# Patient Record
Sex: Male | Born: 1935 | Race: White | Hispanic: No | Marital: Married
Health system: Southern US, Community
[De-identification: ages and names within clinical notes are randomized; demographics above are authoritative.]

## PROBLEM LIST (undated history)

## (undated) DIAGNOSIS — G20 Parkinson's disease: Secondary | ICD-10-CM

## (undated) DIAGNOSIS — I10 Essential (primary) hypertension: Secondary | ICD-10-CM

## (undated) DIAGNOSIS — E785 Hyperlipidemia, unspecified: Secondary | ICD-10-CM

## (undated) DIAGNOSIS — G20A1 Parkinson's disease without dyskinesia, without mention of fluctuations: Secondary | ICD-10-CM

---

## 2004-01-01 ENCOUNTER — Encounter: Payer: Self-pay | Admitting: Internal Medicine

## 2004-04-19 ENCOUNTER — Encounter: Payer: Self-pay | Admitting: Internal Medicine

## 2004-05-03 ENCOUNTER — Encounter: Payer: Self-pay | Admitting: Internal Medicine

## 2004-08-16 ENCOUNTER — Encounter: Payer: Self-pay | Admitting: Internal Medicine

## 2004-08-31 ENCOUNTER — Encounter: Payer: Self-pay | Admitting: Internal Medicine

## 2004-12-20 ENCOUNTER — Encounter: Payer: Self-pay | Admitting: Internal Medicine

## 2004-12-31 ENCOUNTER — Encounter: Payer: Self-pay | Admitting: Internal Medicine

## 2005-05-01 ENCOUNTER — Encounter: Payer: Self-pay | Admitting: Internal Medicine

## 2005-05-03 ENCOUNTER — Encounter: Payer: Self-pay | Admitting: Internal Medicine

## 2005-08-17 ENCOUNTER — Encounter: Payer: Self-pay | Admitting: Internal Medicine

## 2005-08-31 ENCOUNTER — Encounter: Payer: Self-pay | Admitting: Internal Medicine

## 2005-12-12 ENCOUNTER — Encounter: Payer: Self-pay | Admitting: Internal Medicine

## 2005-12-31 ENCOUNTER — Encounter: Payer: Self-pay | Admitting: Internal Medicine

## 2006-04-10 ENCOUNTER — Encounter: Payer: Self-pay | Admitting: Internal Medicine

## 2006-05-03 ENCOUNTER — Encounter: Payer: Self-pay | Admitting: Internal Medicine

## 2006-08-14 ENCOUNTER — Ambulatory Visit: Payer: Self-pay | Admitting: Internal Medicine

## 2006-12-12 ENCOUNTER — Encounter: Payer: Self-pay | Admitting: Internal Medicine

## 2006-12-25 ENCOUNTER — Other Ambulatory Visit: Payer: Self-pay

## 2006-12-25 ENCOUNTER — Inpatient Hospital Stay: Payer: Self-pay | Admitting: Internal Medicine

## 2007-01-01 ENCOUNTER — Encounter: Payer: Self-pay | Admitting: Internal Medicine

## 2010-10-23 ENCOUNTER — Ambulatory Visit: Payer: Self-pay | Admitting: Family Medicine

## 2010-11-03 ENCOUNTER — Ambulatory Visit: Payer: Self-pay | Admitting: Internal Medicine

## 2011-01-22 ENCOUNTER — Ambulatory Visit: Payer: Self-pay | Admitting: Internal Medicine

## 2014-12-16 ENCOUNTER — Other Ambulatory Visit: Payer: Self-pay | Admitting: Physical Medicine and Rehabilitation

## 2014-12-16 DIAGNOSIS — M5416 Radiculopathy, lumbar region: Secondary | ICD-10-CM

## 2014-12-22 ENCOUNTER — Ambulatory Visit
Admission: RE | Admit: 2014-12-22 | Discharge: 2014-12-22 | Disposition: A | Discharge status: Home / Self Care | Payer: Medicare Other | Source: Ambulatory Visit | Attending: Physical Medicine and Rehabilitation | Admitting: Physical Medicine and Rehabilitation

## 2014-12-22 DIAGNOSIS — M5416 Radiculopathy, lumbar region: Secondary | ICD-10-CM | POA: Diagnosis present

## 2014-12-22 DIAGNOSIS — M5126 Other intervertebral disc displacement, lumbar region: Secondary | ICD-10-CM | POA: Diagnosis not present

## 2014-12-22 DIAGNOSIS — M4806 Spinal stenosis, lumbar region: Secondary | ICD-10-CM | POA: Insufficient documentation

## 2017-10-02 ENCOUNTER — Emergency Department
Admission: EM | Admit: 2017-10-02 | Discharge: 2017-10-02 | Disposition: A | Discharge status: Home / Self Care | Payer: Medicare Other | Attending: Emergency Medicine | Admitting: Emergency Medicine

## 2017-10-02 ENCOUNTER — Encounter: Payer: Self-pay | Admitting: Emergency Medicine

## 2017-10-02 ENCOUNTER — Emergency Department: Payer: Medicare Other

## 2017-10-02 DIAGNOSIS — R531 Weakness: Secondary | ICD-10-CM

## 2017-10-02 DIAGNOSIS — W19XXXA Unspecified fall, initial encounter: Secondary | ICD-10-CM

## 2017-10-02 DIAGNOSIS — R55 Syncope and collapse: Secondary | ICD-10-CM | POA: Diagnosis not present

## 2017-10-02 HISTORY — DX: Hyperlipidemia, unspecified: E78.5

## 2017-10-02 HISTORY — DX: Essential (primary) hypertension: I10

## 2017-10-02 LAB — URINALYSIS, COMPLETE (UACMP) WITH MICROSCOPIC
BACTERIA UA: NONE SEEN
BILIRUBIN URINE: NEGATIVE
Glucose, UA: NEGATIVE mg/dL
Hgb urine dipstick: NEGATIVE
KETONES UR: NEGATIVE mg/dL
LEUKOCYTES UA: NEGATIVE
Nitrite: NEGATIVE
PH: 7 (ref 5.0–8.0)
PROTEIN: NEGATIVE mg/dL
SQUAMOUS EPITHELIAL / LPF: NONE SEEN (ref 0–5)
Specific Gravity, Urine: 1.012 (ref 1.005–1.030)
WBC UA: NONE SEEN WBC/hpf (ref 0–5)

## 2017-10-02 LAB — COMPREHENSIVE METABOLIC PANEL
ALBUMIN: 3.5 g/dL (ref 3.5–5.0)
ALT: 9 U/L (ref 0–44)
AST: 20 U/L (ref 15–41)
Alkaline Phosphatase: 70 U/L (ref 38–126)
Anion gap: 6 (ref 5–15)
BUN: 15 mg/dL (ref 8–23)
CHLORIDE: 108 mmol/L (ref 98–111)
CO2: 27 mmol/L (ref 22–32)
CREATININE: 0.61 mg/dL (ref 0.61–1.24)
Calcium: 9 mg/dL (ref 8.9–10.3)
GFR calc Af Amer: >60 mL/min (ref >60)
GLUCOSE: 107 mg/dL — AB (ref 70–99)
Potassium: 3.8 mmol/L (ref 3.5–5.1)
Sodium: 141 mmol/L (ref 135–145)
Total Bilirubin: 1.3 mg/dL — ABNORMAL HIGH (ref 0.3–1.2)
Total Protein: 6.3 g/dL — ABNORMAL LOW (ref 6.5–8.1)

## 2017-10-02 LAB — CBC WITH DIFFERENTIAL/PLATELET
BASOS ABS: 0 10*3/uL (ref 0–0.1)
BASOS PCT: 1 %
Eosinophils Absolute: 0.1 10*3/uL (ref 0–0.7)
Eosinophils Relative: 3 %
HCT: 41.5 % (ref 40.0–52.0)
Hemoglobin: 14.3 g/dL (ref 13.0–18.0)
LYMPHS PCT: 19 %
Lymphs Abs: 1 10*3/uL (ref 1.0–3.6)
MCH: 33.3 pg (ref 26.0–34.0)
MCHC: 34.5 g/dL (ref 32.0–36.0)
MCV: 96.3 fL (ref 80.0–100.0)
Monocytes Absolute: 0.3 10*3/uL (ref 0.2–1.0)
Monocytes Relative: 7 %
NEUTROS ABS: 3.9 10*3/uL (ref 1.4–6.5)
Neutrophils Relative %: 72 %
PLATELETS: 129 10*3/uL — AB (ref 150–440)
RBC: 4.31 MIL/uL — AB (ref 4.40–5.90)
RDW: 14 % (ref 11.5–14.5)
WBC: 5.4 10*3/uL (ref 3.8–10.6)

## 2017-10-02 LAB — TROPONIN I

## 2017-10-02 LAB — GLUCOSE, CAPILLARY: GLUCOSE-CAPILLARY: 95 mg/dL (ref 70–99)

## 2017-10-02 MED ORDER — SODIUM CHLORIDE 0.9 % IV SOLN
Freq: Once | INTRAVENOUS | Status: AC
  Administered 2017-10-02: 17:00:00 via INTRAVENOUS

## 2017-10-02 MED ORDER — SODIUM CHLORIDE 0.9 % IV SOLN
1000.0000 mL | Freq: Once | INTRAVENOUS | Status: AC
  Administered 2017-10-02: 1000 mL via INTRAVENOUS

## 2017-10-02 NOTE — ED Provider Notes (Signed)
Patient feels better after fluids however he still feels unsteady on his feet.  I recommended admission but the patient adamantly declined and wants to be discharged.  His family is supportive of him being discharged as well.  Informed him that he can return anytime and that he will need a lot of support   Jene EveryKinner, Tonishia Steffy, MD 10/02/17 1731

## 2017-10-02 NOTE — ED Notes (Signed)
BP lying 147/79, sitting 131/72. Pt unable to stand long enough to check standing BP. Pt and family state pt typically is able to ambulate independently with rolling walker. EDP Kinner notified. Pt states he would prefer to go home, spouse in agreement with patient. EDP to bedside.

## 2017-10-02 NOTE — ED Provider Notes (Signed)
[facility] Emergency Department Provider Note       Time seen: ----------------------------------------- 2:10 PM on 10/02/2017 -----------------------------------------   I have reviewed the triage vital signs and the nursing notes.  HISTORY   Chief Complaint No chief complaint on file.    HPI Keith Hicks is a 84 y.o. male with no known past medical history at this time who presents to the ED for syncope.  Patient was reportedly at home and he got to go to the bathroom and passed out.  He denies any injuries or trauma from the fall.  Patient states he has not passed out before.  He denies any recent illness, change in his medications or change in his p.o. intake.  No past medical history on file.  There are no active problems to display for this patient.  Allergies Patient has no allergy information on record.  Social History Social History   Tobacco Use  . Smoking status: Not on file  Substance Use Topics  . Alcohol use: Not on file  . Drug use: Not on file   Review of Systems Constitutional: Negative for fever. Cardiovascular: Negative for chest pain. Respiratory: Negative for shortness of breath. Gastrointestinal: Negative for abdominal pain, vomiting and diarrhea. Musculoskeletal: Negative for back pain. Skin: Negative for rash. Neurological: Negative for headaches, positive for weakness  All systems negative/normal/unremarkable except as stated in the HPI  ____________________________________________   PHYSICAL EXAM:  VITAL SIGNS: ED Triage Vitals  Enc Vitals Group     BP      Pulse      Resp      Temp      Temp src      SpO2      Weight      Height      Head Circumference      Peak Flow      Pain Score      Pain Loc      Pain Edu?      Excl. in GC?    Constitutional: Alert and oriented.  No acute distress Eyes: Conjunctivae are normal. Normal extraocular movements. ENT   Head: Normocephalic and  atraumatic.   Nose: No congestion/rhinnorhea.   Mouth/Throat: Mucous membranes are moist.   Neck: No stridor. Cardiovascular: Normal rate, regular rhythm. No murmurs, rubs, or gallops. Respiratory: Normal respiratory effort without tachypnea nor retractions. Breath sounds are clear and equal bilaterally. No wheezes/rales/rhonchi. Gastrointestinal: Soft and nontender. Normal bowel sounds Musculoskeletal: Nontender with normal range of motion in extremities. No lower extremity tenderness nor edema. Neurologic:  Normal speech and language. No gross focal neurologic deficits are appreciated.  Generalized weakness, nothing focal Skin:  Skin is warm, dry and intact. No rash noted. Psychiatric: Mood and affect are normal. Speech and behavior are normal.  ____________________________________________  EKG: Interpreted by me.  Sinus rhythm with a rate of 58 bpm, short PR interval, LVH, anterior Q waves, no ST elevation  ____________________________________________  ED COURSE:  As part of my medical decision making, I reviewed the following data within the electronic MEDICAL RECORD NUMBER History obtained from family if available, nursing notes, old chart and ekg, as well as notes from prior ED visits. Patient presented for syncope, we will assess with labs and imaging as indicated at this time.   Procedures ____________________________________________   LABS (pertinent positives/negatives)  Labs Reviewed  CBC WITH DIFFERENTIAL/PLATELET - Abnormal; Notable for the following components:      Result Value   RBC 4.31 (*)  Platelets 129 (*)    All other components within normal limits  COMPREHENSIVE METABOLIC PANEL - Abnormal; Notable for the following components:   Glucose, Bld 107 (*)    Total Protein 6.3 (*)    Total Bilirubin 1.3 (*)    All other components within normal limits  TROPONIN I  GLUCOSE, CAPILLARY  URINALYSIS, COMPLETE (UACMP) WITH MICROSCOPIC  CBG MONITORING, ED     RADIOLOGY  CT head, chest x-ray Are unremarkable ____________________________________________  DIFFERENTIAL DIAGNOSIS   Dehydration, electrolyte abnormality, arrhythmia, MI, head injury, occult infection  FINAL ASSESSMENT AND PLAN  Syncope   Plan: The patient had presented for a syncopal event that occurred at home. Patient's labs are pending at this time. Patient's imaging is negative thus far.  He is asymptomatic at this time.   Ulice Dash, MD   Note: This note was generated in part or whole with voice recognition software. Voice recognition is usually quite accurate but there are transcription errors that can and very often do occur. I apologize for any typographical errors that were not detected and corrected.     Emily Filbert, MD 10/02/17 1538

## 2017-10-02 NOTE — ED Triage Notes (Signed)
Pt to ED by EMS after LOC episode upon rising this morning. Pt states has continued to feel dizzy throughout the day.

## 2020-04-19 ENCOUNTER — Emergency Department: Payer: Medicare Other

## 2020-04-19 ENCOUNTER — Encounter: Payer: Self-pay | Admitting: Emergency Medicine

## 2020-04-19 ENCOUNTER — Emergency Department
Admission: EM | Admit: 2020-04-19 | Discharge: 2020-04-19 | Disposition: A | Discharge status: Home / Self Care | Payer: Medicare Other | Attending: Emergency Medicine | Admitting: Emergency Medicine

## 2020-04-19 ENCOUNTER — Other Ambulatory Visit: Payer: Self-pay

## 2020-04-19 DIAGNOSIS — Z20822 Contact with and (suspected) exposure to covid-19: Secondary | ICD-10-CM | POA: Insufficient documentation

## 2020-04-19 DIAGNOSIS — E785 Hyperlipidemia, unspecified: Secondary | ICD-10-CM | POA: Insufficient documentation

## 2020-04-19 DIAGNOSIS — M25552 Pain in left hip: Secondary | ICD-10-CM | POA: Insufficient documentation

## 2020-04-19 DIAGNOSIS — I10 Essential (primary) hypertension: Secondary | ICD-10-CM | POA: Diagnosis not present

## 2020-04-19 DIAGNOSIS — W19XXXA Unspecified fall, initial encounter: Secondary | ICD-10-CM | POA: Insufficient documentation

## 2020-04-19 DIAGNOSIS — J811 Chronic pulmonary edema: Secondary | ICD-10-CM | POA: Diagnosis not present

## 2020-04-19 DIAGNOSIS — G20 Parkinson's disease: Secondary | ICD-10-CM | POA: Diagnosis not present

## 2020-04-19 DIAGNOSIS — F0280 Dementia in other diseases classified elsewhere without behavioral disturbance: Secondary | ICD-10-CM | POA: Insufficient documentation

## 2020-04-19 HISTORY — DX: Parkinson's disease: G20

## 2020-04-19 HISTORY — DX: Parkinson's disease without dyskinesia, without mention of fluctuations: G20.A1

## 2020-04-19 LAB — CBC WITH DIFFERENTIAL/PLATELET
Abs Immature Granulocytes: 0.06 10*3/uL (ref 0.00–0.07)
Basophils Absolute: 0 10*3/uL (ref 0.0–0.1)
Basophils Relative: 0 %
Eosinophils Absolute: 0.1 10*3/uL (ref 0.0–0.5)
Eosinophils Relative: 2 %
HCT: 33.6 % — ABNORMAL LOW (ref 39.0–52.0)
Hemoglobin: 10.8 g/dL — ABNORMAL LOW (ref 13.0–17.0)
Immature Granulocytes: 1 %
Lymphocytes Relative: 10 %
Lymphs Abs: 0.8 10*3/uL (ref 0.7–4.0)
MCH: 28.6 pg (ref 26.0–34.0)
MCHC: 32.1 g/dL (ref 30.0–36.0)
MCV: 89.1 fL (ref 80.0–100.0)
Monocytes Absolute: 0.5 10*3/uL (ref 0.1–1.0)
Monocytes Relative: 6 %
Neutro Abs: 6.3 10*3/uL (ref 1.7–7.7)
Neutrophils Relative %: 81 %
Platelets: 159 10*3/uL (ref 150–400)
RBC: 3.77 MIL/uL — ABNORMAL LOW (ref 4.22–5.81)
RDW: 16.5 % — ABNORMAL HIGH (ref 11.5–15.5)
WBC: 7.8 10*3/uL (ref 4.0–10.5)
nRBC: 0 % (ref 0.0–0.2)

## 2020-04-19 LAB — COMPREHENSIVE METABOLIC PANEL
ALT: 11 U/L (ref 0–44)
AST: 21 U/L (ref 15–41)
Albumin: 3.5 g/dL (ref 3.5–5.0)
Alkaline Phosphatase: 110 U/L (ref 38–126)
Anion gap: 7 (ref 5–15)
BUN: 30 mg/dL — ABNORMAL HIGH (ref 8–23)
CO2: 23 mmol/L (ref 22–32)
Calcium: 9.7 mg/dL (ref 8.9–10.3)
Chloride: 110 mmol/L (ref 98–111)
Creatinine, Ser: 0.75 mg/dL (ref 0.61–1.24)
GFR, Estimated: >60 mL/min (ref >60)
Glucose, Bld: 114 mg/dL — ABNORMAL HIGH (ref 70–99)
Potassium: 5.1 mmol/L (ref 3.5–5.1)
Sodium: 140 mmol/L (ref 135–145)
Total Bilirubin: 0.5 mg/dL (ref 0.3–1.2)
Total Protein: 7.3 g/dL (ref 6.5–8.1)

## 2020-04-19 LAB — TROPONIN I (HIGH SENSITIVITY): Troponin I (High Sensitivity): 10 ng/L (ref <18)

## 2020-04-19 LAB — BRAIN NATRIURETIC PEPTIDE: B Natriuretic Peptide: 106.3 pg/mL — ABNORMAL HIGH (ref 0.0–100.0)

## 2020-04-19 MED ORDER — ACETAMINOPHEN 500 MG PO TABS
1000.0000 mg | ORAL_TABLET | Freq: Once | ORAL | Status: AC
  Administered 2020-04-19: 1000 mg via ORAL
  Filled 2020-04-19: qty 2

## 2020-04-19 NOTE — ED Notes (Signed)
[facility]  CALLED   FOR  TRANSPORT  TO [facility] 

## 2020-04-19 NOTE — ED Triage Notes (Signed)
EMS brings pt in from [facility]; staff reports unwitnessed fall from bed; pt denies falling and st he has no c/o

## 2020-04-19 NOTE — ED Notes (Signed)
Pt incontinent of urine , pt cleansed , purewick applied , monitor applied

## 2020-04-19 NOTE — ED Triage Notes (Signed)
Pt to triage via w/c with no distress noted; pt reports he got dizzy and fell; c/o pain to left shoulder, left hip and left ankle; denies hitting head but c/o persistent dizziness

## 2020-04-19 NOTE — ED Notes (Signed)
Called [facility] who stated that [facility] will call back to arrange transportation and take report on patient.

## 2020-04-19 NOTE — ED Provider Notes (Signed)
[facility] Emergency Department Provider Note  ____________________________________________   Event Date/Time   First MD Initiated Contact with Patient 04/19/20 1137     (approximate)  I have reviewed the triage vital signs and the nursing notes.   HISTORY  Chief Complaint Fall and Dizziness   HPI Keith Hicks is a 87 y.o. male with a PMH of HTN, HDL, and Parkinson who presents via EMS from a nursing facility for further evaluation after a fall.  Patient is able to state the month and the year and that he still has some pain in his left hip but is unable to provide any additional details.  He denies any other acute pain.  He did attempt to reach staff at facility but was unable to provide any additional details.  Staff did verify that he is not on any blood thinners and has some memory difficulty at baseline.  No other history is immediately available on patient arrival.         Past Medical History:  Diagnosis Date  . Hyperlipemia   . Hypertension   . Parkinson's disease (HCC)     There are no problems to display for this patient.   No past surgical history on file.  Prior to Admission medications   Medication Sig Start Date End Date Taking? Authorizing Provider  amLODipine-benazepril (LOTREL) 5-20 MG capsule Take 1 capsule by mouth daily. 09/30/17   [provider]  atorvastatin (LIPITOR) 10 MG tablet Take 1 tablet by mouth daily.    [provider]  metoprolol succinate (TOPROL-XL) 25 MG 24 hr tablet Take 1 tablet by mouth daily. 07/12/17   [provider]    Allergies Patient has no known allergies.  No family history on file.  Social History Social History   Tobacco Use  . Smoking status: Unknown If Ever Smoked  . Smokeless tobacco: Never Used  Substance Use Topics  . Alcohol use: Not Currently  . Drug use: Never    Review of Systems  Review of Systems  Unable to perform ROS: Dementia       ____________________________________________   PHYSICAL EXAM:  VITAL SIGNS: ED Triage Vitals  Enc Vitals Group     BP 04/19/20 0608 (!) 148/71     Pulse Rate 04/19/20 0608 64     Resp 04/19/20 0608 18     Temp 04/19/20 0608 97.8 F (36.6 C)     Temp Source 04/19/20 0608 Oral     SpO2 04/19/20 0608 95 %     Weight 04/19/20 0610 150 lb (68 kg)     Height 04/19/20 0610 5\' 11"  (1.803 m)     Head Circumference --      Peak Flow --      Pain Score 04/19/20 0612 6     Pain Loc --      Pain Edu? --      Excl. in GC? --    Vitals:   04/19/20 0959 04/19/20 1156  BP: (!) 179/69 (!) 179/78  Pulse: (!) 53 62  Resp: 16 16  Temp:    SpO2: 98% 96%   Physical Exam Vitals and nursing note reviewed.  Constitutional:      General: He is not in acute distress. HENT:     Head: Normocephalic and atraumatic.     Right Ear: External ear normal.     Left Ear: External ear normal.     Nose: Nose normal.     Mouth/Throat:  Pharynx: Oropharynx is clear.  Eyes:     Extraocular Movements: Extraocular movements intact.     Conjunctiva/sclera: Conjunctivae normal.     Pupils: Pupils are equal, round, and reactive to light.  Cardiovascular:     Rate and Rhythm: Regular rhythm. Bradycardia present.     Pulses: Normal pulses.  Pulmonary:     Effort: Pulmonary effort is normal. No respiratory distress.     Breath sounds: No stridor. No wheezing, rhonchi or rales.  Chest:     Chest wall: No tenderness.  Abdominal:     General: There is no distension.     Tenderness: There is no abdominal tenderness. There is no right CVA tenderness, left CVA tenderness or guarding.  Musculoskeletal:        General: No deformity.     Cervical back: No rigidity.     Right lower leg: No edema.     Left lower leg: No edema.  Skin:    Capillary Refill: Capillary refill takes less than 2 seconds.  Neurological:     Mental Status: He is alert.  Psychiatric:        Mood and Affect: Mood normal.      2+ bilateral radial and DP pulses. Patient has full and symmetric strength throughout all extremities. Sensation intact throughout all extremities. CN II-XII grossly intact. Oriented to month and year but not date or recent events beyond today.   TTP over L lateral hip. No deformity.  Some mild tenderness over the right parietal scalp. ____________________________________________   LABS (all labs ordered are listed, but only abnormal results are displayed)  Labs Reviewed  CBC WITH DIFFERENTIAL/PLATELET - Abnormal; Notable for the following components:      Result Value   RBC 3.77 (*)    Hemoglobin 10.8 (*)    HCT 33.6 (*)    RDW 16.5 (*)    All other components within normal limits  COMPREHENSIVE METABOLIC PANEL - Abnormal; Notable for the following components:   Glucose, Bld 114 (*)    BUN 30 (*)    All other components within normal limits  BRAIN NATRIURETIC PEPTIDE - Abnormal; Notable for the following components:   B Natriuretic Peptide 106.3 (*)    All other components within normal limits  SARS CORONAVIRUS 2 (TAT 6-24 HRS)  URINALYSIS, COMPLETE (UACMP) WITH MICROSCOPIC  TROPONIN I (HIGH SENSITIVITY)   ____________________________________________  EKG  Junctional rhythm with PVCs, normal axis, artifact vs non-specific chagnes in inferior leads. No other clear evidence of acute ischemia.  ____________________________________________  RADIOLOGY  ED MD interpretation:  CT head and C spine show no acute injury. Plain films of patients chest, L shoulder, and L ankle show no acute fracture or dislocation.  Chest x-ray shows some bilateral patchy haziness without clear focal consolidation, pneumothorax, rib fracture, large effusion or other clear acute process  Official radiology report(s): DG Chest 1 View  Result Date: 04/19/2020 CLINICAL DATA:  Fall.  Dizziness. EXAM: CHEST  1 VIEW COMPARISON:  Chest x-ray 10/29/2017. FINDINGS: Mediastinum and hilar structures are  unremarkable. Heart size normal. Low lung volumes. Diffuse bilateral pulmonary infiltrates/edema noted. No pleural effusion. IMPRESSION: Low lung volumes. Diffuse bilateral pulmonary infiltrates/edema noted. Electronically Signed   By: Maisie Fushomas  Register   On: 04/19/2020 12:08   DG Ankle Complete Left  Result Date: 04/19/2020 CLINICAL DATA:  Fall.  Left ankle pain. EXAM: LEFT ANKLE COMPLETE - 3+ VIEW COMPARISON:  None. FINDINGS: Mild soft tissue swelling is present bilaterally. Ankle joint is located.  No underlying fracture is present. No significant effusion is present. IMPRESSION: Mild soft tissue swelling without underlying fracture. Electronically Signed   By: Marin Robertshristopher  Mattern M.D.   On: 04/19/2020 07:36   CT Head Wo Contrast  Result Date: 04/19/2020 CLINICAL DATA:  Dizziness. Fall. Denies trauma to head. Persistent dizziness. EXAM: CT HEAD WITHOUT CONTRAST TECHNIQUE: Contiguous axial images were obtained from the base of the skull through the vertex without intravenous contrast. COMPARISON:  CT head 10/02/2017 FINDINGS: Brain: Moderate atrophy and scattered white matter disease is stable. No acute infarct, hemorrhage, or mass lesion is present. The ventricles are of normal size. No significant extraaxial fluid collection is present. The brainstem and cerebellum are within normal limits. Vascular: Atherosclerotic calcifications are present within the cavernous internal carotid arteries and at the dural margin of the vertebral arteries. No hyperdense vessel is present. Skull: Calvarium is intact. No focal lytic or blastic lesions are present. Right parietal and occipital scalp soft tissue swelling is present without underlying fracture or foreign body. Sinuses/Orbits: The paranasal sinuses and mastoid air cells are clear. The globes and orbits are within normal limits. IMPRESSION: 1. Right parietal and occipital scalp soft tissue swelling without underlying fracture or foreign body. 2. Stable atrophy  and scattered white matter disease. This likely reflects the sequela of chronic microvascular ischemia. 3. Atherosclerosis. Electronically Signed   By: Marin Robertshristopher  Mattern M.D.   On: 04/19/2020 06:57   CT Cervical Spine Wo Contrast  Addendum Date: 04/19/2020   ADDENDUM REPORT: 04/19/2020 12:58 ADDENDUM: Regarding heterogeneous thyroid without a dominant nodule, this is not clinically significant; no follow-up imaging recommended (ref: J Am Coll Radiol. 2015 Feb;12(2): 143-50). Electronically Signed   By: Marin Robertshristopher  Mattern M.D.   On: 04/19/2020 12:58   Result Date: 04/19/2020 CLINICAL DATA:  Fall.  Dizziness. EXAM: CT CERVICAL SPINE WITHOUT CONTRAST TECHNIQUE: Multidetector CT imaging of the cervical spine was performed without intravenous contrast. Multiplanar CT image reconstructions were also generated. COMPARISON:  None. FINDINGS: Alignment: Slight degenerative anterolisthesis is noted at C3-4 and C4-5. There straightening of the normal cervical lordosis. No other significant listhesis is present. Skull base and vertebrae: Craniocervical junction is normal. Vertebral body heights are normal. Acute or healing fractures are present. Soft tissues and spinal canal: Atherosclerotic calcifications are present at the carotid bifurcations and at the dural margin of both vertebral arteries. No significant prevertebral soft tissue swelling is present. Heterogeneous appearance of the thyroid noted without a significant nodule. Disc levels: Multilevel facet hypertrophy is present throughout the cervical spine. Moderate left and mild right foraminal narrowing is present at C3-4. Moderate foraminal narrowing is present bilaterally at C4-5 and C5-6. Right greater than left foraminal narrowing is present C6-7. Upper chest: Lung apices are clear. IMPRESSION: 1. No acute fracture or traumatic subluxation. 2. Multilevel degenerative changes of the cervical spine as described. 3. Heterogeneous appearance of the thyroid  noted without a significant nodule. Electronically Signed: By: Marin Robertshristopher  Mattern M.D. On: 04/19/2020 07:04   CT Hip Left Wo Contrast  Result Date: 04/19/2020 CLINICAL DATA:  Left hip pain.  Unwitnessed fall. EXAM: CT OF THE LEFT HIP WITHOUT CONTRAST TECHNIQUE: Multidetector CT imaging of the left hip was performed according to the standard protocol. Multiplanar CT image reconstructions were also generated. COMPARISON:  Radiographs 04/19/2020 FINDINGS: Bones/Joint/Cartilage No well-defined fracture or acute bony abnormality identified. Degenerative subcortical cysts are present in the upper acetabular rim. There is spurring of the left femoral head which appears roughly similar to the 10/02/2017 exam, along  with some exaggerated external rotation of the left hip which likewise appears to be chronic. Ligaments Suboptimally assessed by CT. Muscles and Tendons Chronic atrophy of the gluteus minimus muscle, not uncommon in this age group. Soft tissues External iliac, common femoral, and superficial femoral artery atherosclerotic calcification. Prominence of stool in the distal sigmoid colon and rectal vault, constipation not excluded. Suspected prostatomegaly. IMPRESSION: 1. No well-defined fracture or acute bony abnormality is identified. Please note that MRI can have higher sensitivity for subtle fractures and stress injuries. 2. Degenerative subcortical cysts in the upper acetabular rim. 3. Prominence of stool in the distal sigmoid colon and rectal vault, constipation not excluded. 4. Suspected prostatomegaly. 5. Chronic atrophy of the gluteus minimus muscle, not uncommon in this age group. 6. External iliac, common femoral, and superficial femoral artery atherosclerotic calcification. Electronically Signed   By: Gaylyn Rong M.D.   On: 04/19/2020 12:58   DG Shoulder Left  Result Date: 04/19/2020 CLINICAL DATA:  Fall.  Left shoulder pain. EXAM: LEFT SHOULDER - 2+ VIEW COMPARISON:  One view chest x-ray  05/05/2017 FINDINGS: Left shoulder is located. No acute abnormalities are present. Mild degenerative changes are noted at the [facility] joint. Atherosclerotic calcifications are present at the aorta. Left hemithorax is clear. IMPRESSION: 1. No acute abnormality. 2. Atherosclerosis. Electronically Signed   By: Marin Roberts M.D.   On: 04/19/2020 07:35   DG Hip Unilat W or Wo Pelvis 2-3 Views Left  Result Date: 04/19/2020 CLINICAL DATA:  87 year old male status post fall. Hip pain. Parkinson's. EXAM: DG HIP (WITH OR WITHOUT PELVIS) 2-3V LEFT COMPARISON:  Left hip series 10/23/2010. Pelvis radiograph 10/02/2017. FINDINGS: Both hips are rotated, similar to but more pronounced than on the 2017/07/18 comparison. On the initial images at 0647 hours the degree of left leg rotation completely obscures the left femoral neck. Fortunately, repeat images were obtained at 0751 hours, and compared to the 2010/07/19 left hip series the proximal left femur appears stable and intact. Pelvis appears stable and intact. Grossly intact proximal right femur. Vascular calcifications, including calcified right femoral artery atherosclerosis. Negative visible bowel gas pattern. Partially visible lumbar spine degeneration and levoconvex scoliosis. IMPRESSION: No acute fracture or dislocation identified about the left hip or pelvis. Electronically Signed   By: Odessa Fleming M.D.   On: 04/19/2020 08:27    ____________________________________________   PROCEDURES  Procedure(s) performed (including Critical Care):  .1-3 Lead EKG Interpretation Performed by: Gilles Chiquito, MD Authorized by: Gilles Chiquito, MD     Interpretation: normal     ECG rate assessment: normal     Rhythm: sinus rhythm     Ectopy: none     Conduction: normal       ____________________________________________   INITIAL IMPRESSION / ASSESSMENT AND PLAN / ED COURSE      Patient presents with above to history exam for assessment after concern for a fall.  On  arrival patient is afebrile and hemodynamically stable.  He does have some tenderness over his left hip and a little bit of tenderness over his right parietal scalp but is otherwise hemodynamically stable with no other evidence of neurodeficits or significant injury.  CBC shows some mild anemia with no recent priors to compare to.  CMP shows no significant electrolyte or metabolic derangements.  Troponin is nonelevated and given relatively reassuring EKG with patient denying any chest pain low suspicion for ACS at this time.  BNP is not elevated.  While chest x-ray does show bilateral haziness  I suspect this could be related to an asymptomatic atypical infection versus scarring.  Patient does not appear volume overloaded on exam he has no fever or elevated white blood cell count or complaint of cough or shortness of breath to suggest symptomatic pneumonia or any significant heart failure at this time.  COVID swab sent.  Patient discharged stable condition.  Strict return precautions advised and discussed.  Instructed nursing staff at facility to have patient follow-up with PCP in 24 hours return immediately to the emergency room if he experiences any new or acute worsening of symptoms.  ____________________________________________   FINAL CLINICAL IMPRESSION(S) / ED DIAGNOSES  Final diagnoses:  Fall, initial encounter  Person under investigation for COVID-19    Medications  acetaminophen (TYLENOL) tablet 1,000 mg (1,000 mg Oral Given 04/19/20 1215)     ED Discharge Orders    None       Note:  This document was prepared using Dragon voice recognition software and may include unintentional dictation errors.   Gilles Chiquito, MD 04/19/20 1316

## 2020-04-20 LAB — SARS CORONAVIRUS 2 (TAT 6-24 HRS): SARS Coronavirus 2: NEGATIVE

## 2020-06-02 ENCOUNTER — Other Ambulatory Visit: Payer: Self-pay

## 2020-06-02 ENCOUNTER — Emergency Department: Payer: Medicare Other

## 2020-06-02 ENCOUNTER — Encounter: Payer: Self-pay | Admitting: Emergency Medicine

## 2020-06-02 ENCOUNTER — Inpatient Hospital Stay
Admission: EM | Admit: 2020-06-02 | Discharge: 2020-06-13 | DRG: 640 | Disposition: A | Discharge status: Skilled Nursing Facility | Payer: Medicare Other | Attending: Internal Medicine | Admitting: Internal Medicine

## 2020-06-02 DIAGNOSIS — H9190 Unspecified hearing loss, unspecified ear: Secondary | ICD-10-CM | POA: Diagnosis present

## 2020-06-02 DIAGNOSIS — D72819 Decreased white blood cell count, unspecified: Secondary | ICD-10-CM | POA: Diagnosis not present

## 2020-06-02 DIAGNOSIS — Z20822 Contact with and (suspected) exposure to covid-19: Secondary | ICD-10-CM | POA: Diagnosis present

## 2020-06-02 DIAGNOSIS — R627 Adult failure to thrive: Secondary | ICD-10-CM | POA: Diagnosis present

## 2020-06-02 DIAGNOSIS — Z66 Do not resuscitate: Secondary | ICD-10-CM | POA: Diagnosis present

## 2020-06-02 DIAGNOSIS — E860 Dehydration: Secondary | ICD-10-CM | POA: Diagnosis present

## 2020-06-02 DIAGNOSIS — G20 Parkinson's disease: Secondary | ICD-10-CM

## 2020-06-02 DIAGNOSIS — E872 Acidosis: Secondary | ICD-10-CM | POA: Diagnosis present

## 2020-06-02 DIAGNOSIS — Z6822 Body mass index (BMI) 22.0-22.9, adult: Secondary | ICD-10-CM

## 2020-06-02 DIAGNOSIS — D509 Iron deficiency anemia, unspecified: Secondary | ICD-10-CM | POA: Diagnosis present

## 2020-06-02 DIAGNOSIS — G9341 Metabolic encephalopathy: Secondary | ICD-10-CM | POA: Diagnosis present

## 2020-06-02 DIAGNOSIS — E038 Other specified hypothyroidism: Secondary | ICD-10-CM | POA: Diagnosis present

## 2020-06-02 DIAGNOSIS — I10 Essential (primary) hypertension: Secondary | ICD-10-CM | POA: Diagnosis present

## 2020-06-02 DIAGNOSIS — T68XXXA Hypothermia, initial encounter: Secondary | ICD-10-CM

## 2020-06-02 DIAGNOSIS — I959 Hypotension, unspecified: Secondary | ICD-10-CM | POA: Diagnosis not present

## 2020-06-02 DIAGNOSIS — D696 Thrombocytopenia, unspecified: Secondary | ICD-10-CM | POA: Diagnosis present

## 2020-06-02 DIAGNOSIS — F0280 Dementia in other diseases classified elsewhere without behavioral disturbance: Secondary | ICD-10-CM | POA: Diagnosis present

## 2020-06-02 DIAGNOSIS — Z7189 Other specified counseling: Secondary | ICD-10-CM

## 2020-06-02 DIAGNOSIS — R680 Hypothermia, not associated with low environmental temperature: Secondary | ICD-10-CM | POA: Diagnosis not present

## 2020-06-02 DIAGNOSIS — Z79899 Other long term (current) drug therapy: Secondary | ICD-10-CM | POA: Diagnosis not present

## 2020-06-02 DIAGNOSIS — Z515 Encounter for palliative care: Secondary | ICD-10-CM

## 2020-06-02 DIAGNOSIS — E875 Hyperkalemia: Secondary | ICD-10-CM | POA: Diagnosis present

## 2020-06-02 DIAGNOSIS — E785 Hyperlipidemia, unspecified: Secondary | ICD-10-CM | POA: Diagnosis present

## 2020-06-02 DIAGNOSIS — G20A1 Parkinson's disease without dyskinesia, without mention of fluctuations: Secondary | ICD-10-CM | POA: Diagnosis present

## 2020-06-02 DIAGNOSIS — L89523 Pressure ulcer of left ankle, stage 3: Secondary | ICD-10-CM | POA: Diagnosis present

## 2020-06-02 DIAGNOSIS — N179 Acute kidney failure, unspecified: Secondary | ICD-10-CM | POA: Diagnosis present

## 2020-06-02 LAB — BLOOD GAS, VENOUS
Acid-base deficit: 3.9 mmol/L — ABNORMAL HIGH (ref 0.0–2.0)
Bicarbonate: 22.6 mmol/L (ref 20.0–28.0)
O2 Saturation: 79.1 %
Patient temperature: 37
pCO2, Ven: 47 mmHg (ref 44.0–60.0)
pH, Ven: 7.29 (ref 7.250–7.430)
pO2, Ven: 49 mmHg — ABNORMAL HIGH (ref 32.0–45.0)

## 2020-06-02 LAB — URINALYSIS, COMPLETE (UACMP) WITH MICROSCOPIC
Bacteria, UA: NONE SEEN
Bilirubin Urine: NEGATIVE
Glucose, UA: NEGATIVE mg/dL
Hgb urine dipstick: NEGATIVE
Ketones, ur: NEGATIVE mg/dL
Leukocytes,Ua: NEGATIVE
Nitrite: NEGATIVE
Protein, ur: NEGATIVE mg/dL
Specific Gravity, Urine: 1.016 (ref 1.005–1.030)
pH: 5 (ref 5.0–8.0)

## 2020-06-02 LAB — BASIC METABOLIC PANEL
Anion gap: 4 — ABNORMAL LOW (ref 5–15)
Anion gap: 7 (ref 5–15)
BUN: 38 mg/dL — ABNORMAL HIGH (ref 8–23)
BUN: 35 mg/dL — ABNORMAL HIGH (ref 8–23)
CO2: 21 mmol/L — ABNORMAL LOW (ref 22–32)
CO2: 22 mmol/L (ref 22–32)
Calcium: 9 mg/dL (ref 8.9–10.3)
Calcium: 8.9 mg/dL (ref 8.9–10.3)
Chloride: 110 mmol/L (ref 98–111)
Chloride: 110 mmol/L (ref 98–111)
Creatinine, Ser: 0.93 mg/dL (ref 0.61–1.24)
Creatinine, Ser: 0.81 mg/dL (ref 0.61–1.24)
GFR, Estimated: >60 mL/min (ref >60)
GFR, Estimated: >60 mL/min (ref >60)
Glucose, Bld: 81 mg/dL (ref 70–99)
Glucose, Bld: 122 mg/dL — ABNORMAL HIGH (ref 70–99)
Potassium: 6.7 mmol/L (ref 3.5–5.1)
Potassium: 5.4 mmol/L — ABNORMAL HIGH (ref 3.5–5.1)
Sodium: 135 mmol/L (ref 135–145)
Sodium: 139 mmol/L (ref 135–145)

## 2020-06-02 LAB — CBC
HCT: 33.2 % — ABNORMAL LOW (ref 39.0–52.0)
Hemoglobin: 10.4 g/dL — ABNORMAL LOW (ref 13.0–17.0)
MCH: 27.9 pg (ref 26.0–34.0)
MCHC: 31.3 g/dL (ref 30.0–36.0)
MCV: 89 fL (ref 80.0–100.0)
Platelets: 144 10*3/uL — ABNORMAL LOW (ref 150–400)
RBC: 3.73 MIL/uL — ABNORMAL LOW (ref 4.22–5.81)
RDW: 17.2 % — ABNORMAL HIGH (ref 11.5–15.5)
WBC: 6 10*3/uL (ref 4.0–10.5)
nRBC: 0 % (ref 0.0–0.2)

## 2020-06-02 LAB — SODIUM, URINE, RANDOM: Sodium, Ur: 112 mmol/L

## 2020-06-02 LAB — CREATININE, URINE, RANDOM: Creatinine, Urine: 63 mg/dL

## 2020-06-02 LAB — COMPREHENSIVE METABOLIC PANEL
ALT: 15 U/L (ref 0–44)
AST: 27 U/L (ref 15–41)
Albumin: 3.7 g/dL (ref 3.5–5.0)
Alkaline Phosphatase: 120 U/L (ref 38–126)
Anion gap: 6 (ref 5–15)
BUN: 39 mg/dL — ABNORMAL HIGH (ref 8–23)
CO2: 21 mmol/L — ABNORMAL LOW (ref 22–32)
Calcium: 9.3 mg/dL (ref 8.9–10.3)
Chloride: 108 mmol/L (ref 98–111)
Creatinine, Ser: 1.05 mg/dL (ref 0.61–1.24)
GFR, Estimated: >60 mL/min (ref >60)
Glucose, Bld: 89 mg/dL (ref 70–99)
Potassium: 6.6 mmol/L (ref 3.5–5.1)
Sodium: 135 mmol/L (ref 135–145)
Total Bilirubin: 0.5 mg/dL (ref 0.3–1.2)
Total Protein: 7 g/dL (ref 6.5–8.1)

## 2020-06-02 LAB — CBG MONITORING, ED: Glucose-Capillary: 154 mg/dL — ABNORMAL HIGH (ref 70–99)

## 2020-06-02 LAB — CK: Total CK: 33 U/L — ABNORMAL LOW (ref 49–397)

## 2020-06-02 LAB — LACTIC ACID, PLASMA: Lactic Acid, Venous: 1 mmol/L (ref 0.5–1.9)

## 2020-06-02 LAB — MAGNESIUM: Magnesium: 2.2 mg/dL (ref 1.7–2.4)

## 2020-06-02 MED ORDER — SODIUM BICARBONATE 4.2 % IV SOLN
5.0000 mL | Freq: Once | INTRAVENOUS | Status: AC
  Administered 2020-06-02: 5 mL via INTRAVENOUS
  Filled 2020-06-02: qty 5

## 2020-06-02 MED ORDER — PATIROMER SORBITEX CALCIUM 8.4 G PO PACK
16.8000 g | PACK | Freq: Every day | ORAL | Status: DC
  Filled 2020-06-02: qty 2

## 2020-06-02 MED ORDER — SODIUM BICARBONATE 8.4 % IV SOLN
50.0000 meq | Freq: Once | INTRAVENOUS | Status: AC
  Administered 2020-06-02: 50 meq via INTRAVENOUS
  Filled 2020-06-02: qty 50

## 2020-06-02 MED ORDER — SODIUM CHLORIDE 0.9 % IV BOLUS
500.0000 mL | Freq: Once | INTRAVENOUS | Status: AC
  Administered 2020-06-02: 500 mL via INTRAVENOUS

## 2020-06-02 MED ORDER — SODIUM CHLORIDE 0.9 % IV SOLN: INTRAVENOUS | Status: DC

## 2020-06-02 MED ORDER — ALBUTEROL SULFATE (2.5 MG/3ML) 0.083% IN NEBU
10.0000 mg | INHALATION_SOLUTION | Freq: Once | RESPIRATORY_TRACT | Status: AC
  Administered 2020-06-02: 10 mg via RESPIRATORY_TRACT
  Filled 2020-06-02: qty 12

## 2020-06-02 MED ORDER — DEXTROSE 50 % IV SOLN
1.0000 | Freq: Once | INTRAVENOUS | Status: AC
  Administered 2020-06-02: 50 mL via INTRAVENOUS
  Filled 2020-06-02: qty 50

## 2020-06-02 MED ORDER — INSULIN ASPART 100 UNIT/ML ~~LOC~~ SOLN
6.0000 [IU] | Freq: Once | SUBCUTANEOUS | Status: AC
  Administered 2020-06-02: 6 [IU] via INTRAVENOUS
  Filled 2020-06-02: qty 1

## 2020-06-02 MED ORDER — CALCIUM GLUCONATE-NACL 1-0.675 GM/50ML-% IV SOLN
1.0000 g | Freq: Once | INTRAVENOUS | Status: AC
  Administered 2020-06-02: 1000 mg via INTRAVENOUS
  Filled 2020-06-02: qty 50

## 2020-06-02 MED ORDER — SODIUM CHLORIDE 0.9 % IV SOLN: INTRAVENOUS | Status: AC

## 2020-06-02 MED ORDER — INSULIN ASPART 100 UNIT/ML IV SOLN
5.0000 [IU] | Freq: Once | INTRAVENOUS | Status: AC
  Administered 2020-06-02: 5 [IU] via INTRAVENOUS
  Filled 2020-06-02: qty 0.05

## 2020-06-02 NOTE — ED Provider Notes (Signed)
[facility] Emergency Department Provider Note    Event Date/Time   First MD Initiated Contact with Patient 06/02/20 1316     (approximate)  I have reviewed the triage vital signs and the nursing notes.   HISTORY  Chief Complaint Failure To Thrive    HPI Keith Hicks is a 87 y.o. male with the below listed past medical history presents to the ER for evaluation of confusion last seen normal yesterday and was at his baseline.  Has a history of Parkinson.  No report of any falls but not certain.  When asked questions patient states "I just feel like I have been somewhere else.  "He repeats this several times.  Denies any new medications.  No other complaints reported.    Past Medical History:  Diagnosis Date  . Hyperlipemia   . Hypertension   . Parkinson's disease (HCC)    History reviewed. No pertinent family history. History reviewed. No pertinent surgical history. Patient Active Problem List   Diagnosis Date Noted  . Hyperkalemia 06/02/2020      Prior to Admission medications   Medication Sig Start Date End Date Taking? Authorizing Provider  benazepril (LOTENSIN) 10 MG tablet Take 15 mg by mouth in the morning. 05/18/20   [provider]  carbidopa-levodopa (SINEMET IR) 25-100 MG tablet Take 1 tablet by mouth 3 (three) times daily before meals. 05/18/20   [provider]  diclofenac Sodium (VOLTAREN) 1 % GEL Apply 4 g topically 2 (two) times daily. 05/15/20   [provider]    Allergies Patient has no known allergies.    Social History Social History   Tobacco Use  . Smoking status: Unknown If Ever Smoked  . Smokeless tobacco: Never Used  Substance Use Topics  . Alcohol use: Not Currently  . Drug use: Never    Review of Systems Patient denies headaches, rhinorrhea, blurry vision, numbness, shortness of breath, chest pain, edema, cough, abdominal pain, nausea, vomiting, diarrhea, dysuria, fevers, rashes  or hallucinations unless otherwise stated above in HPI. ____________________________________________   PHYSICAL EXAM:  VITAL SIGNS: Vitals:   06/02/20 1230 06/02/20 1509  BP: (!) 169/66 (!) 167/68  Pulse: (!) 56 69  Resp: 17 14  Temp: (!) 97.4 F (36.3 C)   SpO2: 98% 99%    Constitutional: Alert , frail appearing Eyes: Conjunctivae are normal.  Head: Atraumatic. Nose: No congestion/rhinnorhea. Mouth/Throat: Mucous membranes are moist.   Neck: No stridor. Painless ROM.  Cardiovascular: Normal rate, regular rhythm. Grossly normal heart sounds.  Good peripheral circulation. Respiratory: Normal respiratory effort.  No retractions. Lungs CTAB. Gastrointestinal: Soft and nontender. No distention. No abdominal bruits. No CVA tenderness. Genitourinary: deferred Musculoskeletal: No lower extremity tenderness nor edema.  No joint effusions. Neurologic:  Normal speech and language. No gross focal neurologic deficits are appreciated. No facial droop Skin:  Skin is warm, dry and intact. No rash noted. Psychiatric: Mood and affect are normal. Speech and behavior are normal.  ____________________________________________   LABS (all labs ordered are listed, but only abnormal results are displayed)  Results for orders placed or performed during the hospital encounter of 06/02/20 (from the past 24 hour(s))  Comprehensive metabolic panel     Status: Abnormal   Collection Time: 06/02/20 12:34 PM  Result Value Ref Range   Sodium 135 135 - 145 mmol/L   Potassium 6.6 (HH) 3.5 - 5.1 mmol/L   Chloride 108 98 - 111 mmol/L   CO2 21 (L) 22 - 32  mmol/L   Glucose, Bld 89 70 - 99 mg/dL   BUN 39 (H) 8 - 23 mg/dL   Creatinine, Ser 6.04 0.61 - 1.24 mg/dL   Calcium 9.3 8.9 - 54.0 mg/dL   Total Protein 7.0 6.5 - 8.1 g/dL   Albumin 3.7 3.5 - 5.0 g/dL   AST 27 15 - 41 U/L   ALT 15 0 - 44 U/L   Alkaline Phosphatase 120 38 - 126 U/L   Total Bilirubin 0.5 0.3 - 1.2 mg/dL   GFR, Estimated >98 >11 mL/min    Anion gap 6 5 - 15  CBC     Status: Abnormal   Collection Time: 06/02/20 12:34 PM  Result Value Ref Range   WBC 6.0 4.0 - 10.5 K/uL   RBC 3.73 (L) 4.22 - 5.81 MIL/uL   Hemoglobin 10.4 (L) 13.0 - 17.0 g/dL   HCT 91.4 (L) 78.2 - 95.6 %   MCV 89.0 80.0 - 100.0 fL   MCH 27.9 26.0 - 34.0 pg   MCHC 31.3 30.0 - 36.0 g/dL   RDW 21.3 (H) 08.6 - 57.8 %   Platelets 144 (L) 150 - 400 K/uL   nRBC 0.0 0.0 - 0.2 %   ____________________________________________  EKG My review and personal interpretation at Time: 14:18   Indication: hyperkalemia  Rate: 55  Rhythm: junctional  Axis: normal Other:hyperacute t waves, no stemi ____________________________________________  RADIOLOGY  I personally reviewed all radiographic images ordered to evaluate for the above acute complaints and reviewed radiology reports and findings.  These findings were personally discussed with the patient.  Please see medical record for radiology report.  ____________________________________________   PROCEDURES  Procedure(s) performed:  .Critical Care Performed by: Willy Eddy, MD Authorized by: Willy Eddy, MD   Critical care provider statement:    Critical care time (minutes):  34   Critical care time was exclusive of:  Separately billable procedures and treating other patients   Critical care was necessary to treat or prevent imminent or life-threatening deterioration of the following conditions:  Metabolic crisis   Critical care was time spent personally by me on the following activities:  Development of treatment plan with patient or surrogate, discussions with consultants, evaluation of patient's response to treatment, examination of patient, obtaining history from patient or surrogate, ordering and performing treatments and interventions, ordering and review of laboratory studies, ordering and review of radiographic studies, pulse oximetry, re-evaluation of patient's condition and review of old  charts      Critical Care performed: yes ____________________________________________   INITIAL IMPRESSION / ASSESSMENT AND PLAN / ED COURSE  Pertinent labs & imaging results that were available during my care of the patient were reviewed by me and considered in my medical decision making (see chart for details).   DDX: Dehydration, sepsis, pna, uti, hypoglycemia, cva, drug effect, withdrawal, encephalitis   Keith Hicks is a 87 y.o. who presents to the ED with presentation as described above.  Patient with no focal deficit but does appear to be declining may be secondary to dementia blood work shows evidence of hyperkalemia the patient does have hyperacute T waves with junctional rhythm therefore was treated.  CT head was ordered due to altered mental status.  Patient did fail swallow screen.  Therefore Veltassa was held.  Was given IV insulin as well as bicarb fluids and IV calcium.  Discussed case with hospitalist.  He is on benazepril likely offending agent no other clear inciting factors.     The  patient was evaluated in Emergency Department today for the symptoms described in the history of present illness. He/she was evaluated in the context of the global COVID-19 pandemic, which necessitated consideration that the patient might be at risk for infection with the SARS-CoV-2 virus that causes COVID-19. Institutional protocols and algorithms that pertain to the evaluation of patients at risk for COVID-19 are in a state of rapid change based on information released by regulatory bodies including the CDC and federal and state organizations. These policies and algorithms were followed during the patient's care in the ED.  As part of my medical decision making, I reviewed the following data within the electronic MEDICAL RECORD NUMBER Nursing notes reviewed and incorporated, Labs reviewed, notes from prior ED visits and [place] Controlled Substance  Database   ____________________________________________   FINAL CLINICAL IMPRESSION(S) / ED DIAGNOSES  Final diagnoses:  Hyperkalemia      NEW MEDICATIONS STARTED DURING THIS VISIT:  New Prescriptions   No medications on file     Note:  This document was prepared using Dragon voice recognition software and may include unintentional dictation errors.Willy Eddy, MD 06/02/20 1539

## 2020-06-02 NOTE — Progress Notes (Signed)
Update: Repeat BMP shows no significant improvement in serum potassium level, with the PA 96.7 relative to presenting 6.6.  This is in the setting of interval administration of NovoLog 6 units IV x1 with 20 of D50, sodium bicarbonate x1 amp, calcium gluconate 1 g IV x1, and a 500 cc normal saline bolus.  Of note, the patient has failed his nursing bedside swallow screen on 2 occasions now, and therefore is unable to safely take p.o. at the present time, thereby limiting options such as Lokelma versus Kayexalate.  This updated BMP continues to reflect no anion gap metabolic acidosis, although renal function has demonstrated a degree of interval improvement relative to that at presentation.   In response to this updated serum potassium level, I have ordered the following: NovoLog 5 units IV x1 along with D50, followed by checking of point-of-care glucose in 1 hour following this administration.  Additionally, have ordered 1 amp of sodium bicarb IV x1, calcium gluconate 1 g IV x1, albuterol nebulizer x1.  will also order an additional 500 cc normal saline bolus, followed by initiation of maintenance small saline 75 cc/h.  In order to further evaluate non-anion gap metabolic acidosis, will check VBG.  Will also check CPK and the presence of metabolic acidosis, AKI, altered mental status.  We will also check lactic acid level.  Repeat BMP has been ordered for 2330.  We will also repeat EKG, and continue to monitor on telemetry.   Formal swallow evaluation has been ordered via SLP consult to occur in the AM.       Newton Pigg, DO Hospitalist

## 2020-06-02 NOTE — ED Notes (Signed)
MD notified of critical K+

## 2020-06-02 NOTE — H&P (Signed)
History and Physical    PLEASE NOTE THAT DRAGON DICTATION SOFTWARE WAS USED IN THE CONSTRUCTION OF THIS NOTE.   Keith Hicks:[id number] DOB: Jul 08, 1924 DOA: 06/02/2020  PCP: Almetta Lovely, [facility] Patient coming from: [facility]  I have personally briefly reviewed patient's old medical records in [facility]  Chief Complaint: Altered mental status  HPI: Keith Hicks is a 87 y.o. male with medical history significant for dementia, Parkinson's disease, hypertension, who is admitted to [facility] on 06/02/2020 with hyperkalemia after presenting from [facility] to [facility] ED for evaluation of altered mental status.   In the context of the patient's altered mental status, the following history was obtained via my discussions with the emergency department physician as well as via chart review.  In the setting of baseline dementia, the staff at [facility] feel that the patient has been more confused relative to this baseline over the course of the last 2 days.  They reportedly have not detected significant somnolence over that time, but rather believe that patient is exhibiting worsening confusion over that timeframe.  This is been associated with decline in oral intake over the last 2 to 3 days, in the absence of any reported vomiting or diarrhea.  No evidence of objective fever at SNF.  No reported recent trauma.  No evidence of respiratory distress or cough.  Patient's medical history is notable for history of essential hypertension, for which he is on benazepril as a sole outpatient antihypertensive agent.  In the setting of Parkinson's disease, he is also on levodopa/carbidopa.  Does not appear to be on any testing as an outpatient.     ED Course:  Vital signs in the ED were notable for the following: Tetramex nine 7.9, heart rate 57-69, 159/91 -167/68; respiratory rate 17-18, oxygen saturation 97 to 99%.  Labs were notable for the following: CMP was notable for  the following: Sodium 135, potassium 6.6, which is relative to most recent prior potassium value 5.1 on 04/19/2020, bicarbonate 21, anion gap 6, BUN 39, creatinine 1.05 relative to 0.75 on 04/19/2020, glucose 89.  CBC notable for white blood cell count 6000.  Urinalysis showed no white blood cells, no bacteria, nitrate negative, leukocyte Estrace negative.  Nasopharyngeal COVID-19 PCR was performed in the emergency department today, with result currently pending.  Chest x-ray in comparison to most recent prior from 04/19/2020 showed interval improvement of bilateral interstitial prominence, with mild residual interstitial prominence still noted.  Noncontrast CT that showed no evidence of acute intracranial process, including no evidence of intracranial hemorrhage or acute ischemic infarct.  In the setting of presenting hyperkalemia, the following were administered in the ED: NovoLog 6 units IV x1 along with 1 amp of D50, sodium bicarbonate times 1 AM, calcium gluconate 1 g IV x1, and a 500 cc normal saline bolus.  Patient was noted to fails nursing bedside swallow screen, and therefore he was unable to receive Veltassa.   Subsequently, the patient was admitted to the PCU for further evaluation and management of presenting hyperkalemia associated with acute kidney injury in addition to further evaluation for suspected acute encephalopathy superimposed on dementia.    Review of Systems: As per HPI otherwise 10 point review of systems negative.   Past Medical History:  Diagnosis Date  . Hyperlipemia   . Hypertension   . Parkinson's disease (HCC)     History reviewed. No pertinent surgical history.  Social History:  has an unknown smoking status. He  has never used smokeless tobacco. He reports previous alcohol use. He reports that he does not use drugs.   No Known Allergies  History reviewed. No pertinent family history. *   Prior to Admission medications   Medication Sig Start Date End Date  Taking? Authorizing Provider  acetaminophen (TYLENOL) 650 MG CR tablet Take 650 mg by mouth 3 (three) times daily.   Yes [provider]  benazepril (LOTENSIN) 10 MG tablet Take 15 mg by mouth in the morning. 05/18/20  Yes [provider]  carbidopa-levodopa (SINEMET IR) 25-100 MG tablet Take 1 tablet by mouth 3 (three) times daily before meals. 05/18/20  Yes [provider]  diclofenac Sodium (VOLTAREN) 1 % GEL Apply 4 g topically 2 (two) times daily. 05/15/20  Yes [provider]     Objective    Physical Exam: Vitals:   06/02/20 1231 06/02/20 1509 06/02/20 1600 06/02/20 1800  BP:  (!) 167/68 (!) 169/66 (!) 159/91  Pulse:  69 (!) 57 (!) 59  Resp:  14 12 18   Temp:      TempSrc:      SpO2:  99% 98% 97%  Weight: 74.8 kg     Height: 5\' 10"  (1.778 m)       General: appears to be stated age; alert; not oriented to person, place, time Skin: warm, dry, no rash Head:  AT/[place] Mouth:  Oral mucosa membranes appear dry, normal dentition Neck: supple; trachea midline Heart:  RRR; did not appreciate any M/R/G Lungs: CTAB, did not appreciate any wheezes, rales, or rhonchi Abdomen: + BS; soft, ND, NT Vascular: 2+ pedal pulses b/l; 2+ radial pulses b/l Extremities: no peripheral edema, no muscle wasting Neuro: strength and sensation intact in upper and lower extremities b/l   Labs on Admission: I have personally reviewed following labs and imaging studies  CBC: Recent Labs  Lab 06/02/20 1234  WBC 6.0  HGB 10.4*  HCT 33.2*  MCV 89.0  PLT 144*   Basic Metabolic Panel: Recent Labs  Lab 06/02/20 1234  NA 135  K 6.6*  CL 108  CO2 21*  GLUCOSE 89  BUN 39*  CREATININE 1.05  CALCIUM 9.3   GFR: Estimated Creatinine Clearance: 45.4 mL/min (by C-G formula based on SCr of 1.05 mg/dL). Liver Function Tests: Recent Labs  Lab 06/02/20 1234  AST 27  ALT 15  ALKPHOS 120  BILITOT 0.5  PROT 7.0  ALBUMIN 3.7   No results for input(s): LIPASE,  AMYLASE in the last 168 hours. No results for input(s): AMMONIA in the last 168 hours. Coagulation Profile: No results for input(s): INR, PROTIME in the last 168 hours. Cardiac Enzymes: No results for input(s): CKTOTAL, CKMB, CKMBINDEX, TROPONINI in the last 168 hours. BNP (last 3 results) No results for input(s): PROBNP in the last 8760 hours. HbA1C: No results for input(s): HGBA1C in the last 72 hours. CBG: No results for input(s): GLUCAP in the last 168 hours. Lipid Profile: No results for input(s): CHOL, HDL, LDLCALC, TRIG, CHOLHDL, LDLDIRECT in the last 72 hours. Thyroid Function Tests: No results for input(s): TSH, T4TOTAL, FREET4, T3FREE, THYROIDAB in the last 72 hours. Anemia Panel: No results for input(s): VITAMINB12, FOLATE, FERRITIN, TIBC, IRON, RETICCTPCT in the last 72 hours. Urine analysis:    Component Value Date/Time   COLORURINE YELLOW (A) 06/02/2020 1822   APPEARANCEUR CLEAR (A) 06/02/2020 1822   LABSPEC 1.016 06/02/2020 1822   PHURINE 5.0 06/02/2020 1822   GLUCOSEU NEGATIVE 06/02/2020 1822   HGBUR  NEGATIVE 06/02/2020 1822   BILIRUBINUR NEGATIVE 06/02/2020 1822   KETONESUR NEGATIVE 06/02/2020 1822   PROTEINUR NEGATIVE 06/02/2020 1822   NITRITE NEGATIVE 06/02/2020 1822   LEUKOCYTESUR NEGATIVE 06/02/2020 1822    Radiological Exams on Admission: CT Head Wo Contrast  Result Date: 06/02/2020 CLINICAL DATA:  Mental status change EXAM: CT HEAD WITHOUT CONTRAST TECHNIQUE: Contiguous axial images were obtained from the base of the skull through the vertex without intravenous contrast. COMPARISON:  04/19/2020 FINDINGS: Brain: Mild atrophy.  Mild white matter hypodensity bilaterally. Negative for acute infarct, hemorrhage, mass. Vascular: Negative for hyperdense vessel. Skull: Negative Sinuses/Orbits: Paranasal sinuses clear. Mild right mastoid tip effusion. Left mastoid clear. Negative orbit Other: None IMPRESSION: No acute abnormality no change from the prior study. Mild  atrophy and mild chronic microvascular ischemic change in the white matter. Electronically Signed   By: Marlan Palau M.D.   On: 06/02/2020 14:15   DG Chest Portable 1 View  Result Date: 06/02/2020 CLINICAL DATA:  Altered mental status EXAM: PORTABLE CHEST 1 VIEW COMPARISON:  04/19/2020 FINDINGS: Stable cardiomediastinal contours. Atherosclerotic calcification of the aortic knob. Previously seen bilateral airspace opacities have resolved. Minimal persistent interstitial prominence. No pleural effusion or pneumothorax. IMPRESSION: Mild bilateral interstitial prominence, which could reflect mild edema. Appearance has improved compared to the previous study. Electronically Signed   By: Duanne Guess D.O.   On: 06/02/2020 14:39     Assessment/Plan   BERNIE RANSFORD is a 87 y.o. male with medical history significant for dementia, Parkinson's disease, hypertension, who is admitted to [facility] on 06/02/2020 with hyperkalemia after presenting from [facility] SN[facility] to [facility] ED for evaluation of altered mental status.    Principal Problem:   Hyperkalemia Active Problems:   AKI (acute kidney injury) (HCC)   Dehydration   Acute metabolic encephalopathy   Hypertension   Parkinson's disease (HCC)    #) Hyperkalemia: Presenting serum potassium noted to be 6.6 relative to 5.1 in January 2022.  Suspect that this is multifactorial in etiology, with contributions from dehydration the setting of her recent decline in oral intake as well as consequential prerenal AKI.  Additionally, suspect contribution from outpatient benazepril.  Of note, the patient is also on Voltaren gel as an outpatient, raising the possibility for some degree of systemic absorption of this incident and an ACE inhibitor, all in the context of dehydration and AKI.  Check stat EKG to evaluate for changes associated with hyperkalemia.  Of note, while in the ED today, the following were administered: 6 units IV as well as amp  of D50, sodium bicarbonate x1 amp, calcium gluconate 1 g IV x1, and a 500 cc normal saline bolus.  Unable to administer Lokelma versus Kayexalate in the setting of the patient's inability to pass the nursing bedside swallow screen.  We will continue maintenance IV fluids did not do contributions from dehydration and AKI, and repeat BMP1900 to evaluate for interval trend and need for additional pharmacologic intervention.  Plan: Repeat BMP 19 glucose monitoring of interval trend in serum potassium level as well as renal function.  Add on serum magnesium level.  Check ionized calcium.  Normal saline at 50 cc/h.  Monitor strict I's and O's and daily weights.  Monitor on telemetry.  EKG x1 now, as above.  Hold home ACE inhibitor as well as Voltaren gel.  Repeat BMP is also been ordered for the morning, at which time we will recheck serum magnesium and ionized calcium levels.      #)  Acute kidney injury: Presenting serum creatinine noted to be 1.05 relative to most recent prior value 0.75 when checked in January 2022.  Suspect that this is prerenal in nature in the setting of intravascular depletion stemming from dehydration as a consequence of reported decline in oral intake over the last 2 to 3 days, with further substantiation for the presence of prerenal azotemia, with potential pharmacologic exacerbation stemming from benazepril as well as potential Duragel, as further described above.  Presenting urinalysis without evidence of overt urinary gas.  Appears to be a contributory factor leading to hyperkalemia, as above.  Plan: Work-up and management of hyperkalemia, as above, including maintenance IV fluids, as above.  Repeat BMP at 1900.  Monitor strict I's and O's and daily weights.  Tempt avoid nephrotoxic agents.  Hold home aspirin as well as Voltaren gel.  Repeat BMP in the morning.  Add on random urine sodium as well as random urine creatinine.      #) Acute metabolic encephalopathy: Reported 2  days of confusion relative to baseline dementia; suspect there is a metabolic contribution stemming from presenting dehydration as well as contribution from AKI and potential increasing constipation medications, including central acting levodopa/carbidopa.  While patient's ability to follow instructions by way participation in the neurologic assessment , he is noted to be moving all 4 extremities spontaneously, rendering the possibility of acute CVA to be less likely.  Additionally, presenting noncontrast CT of the head showed no evidence of acute intracranial process.  No overt evidence of underlying infectious process at this time, including presenting urinalysis which is found to be inconsistent with UTI.  Additionally, presenting chest x-ray shows no evidence of acute cardiopulmonary process.  Of note, screening nasopharyngeal COVID-19 PCR performed in the ED today is currently pending.  Seizures are clinically felt to be less likely at this time.  We will keep patient n.p.o. until mental status improves sufficiently that he is able to participate in and pass nursing bedside swallow evaluation.    Plan: NPO. Nursing bedside swallow evaluation prior to the initiation of a diet/oral medications, as described above.  Follow-up result of COVID-19 screen performed in the ED today.  Check TSH, VBG to evaluate acid-base status as well as to assess for any contribution from hypercapnic encephalopathy..  Repeat BMP in the morning as well as CBC at that time.  Recommend management of presenting AKI/dehydration, as further described above, including maintenance IV fluids.     #) Essential hypertension: On benazepril as well patient hypertensive agent.  Presenting systolic blood pressures noted to be in the 150s mmhg. in the setting of current n.p.o. status as well as presenting hyperkalemia and AKI, will hold home benazepril for now.     Plan: Hold benazepril for now. Close monitoring of ensuing blood pressure  via routine vital signs.      #) Parkinson's disease: On levodopa / carbidopa as outpatient.  Plan: In the setting of current n.p.o. status, will hold on levodopa/ Carbidopa for now.     DVT prophylaxis: scd's  Code Status: DNR/DNI per reported documentation from SNF Disposition Plan: Per Rounding Team Consults called: none  Admission status: Inpatient; PCU    Of note, this patient was added by me to the following Admit List/Treatment Team: [facility].      PLEASE NOTE THAT DRAGON DICTATION SOFTWARE WAS USED IN THE CONSTRUCTION OF THIS NOTE.   Angie FavaJustin B Phoenix Riesen DO [facility] Pager [phone] From 12PM - 12AM  Otherwise, please contact night-coverage  [email or web address] Password [facility]   06/02/2020, 7:08 PM

## 2020-06-02 NOTE — ED Triage Notes (Addendum)
Pt comes into the ED via [facility] from [facility] c/o failure to thrive.  Pt normally doesn't speak a lot to the staff, but today he wouldn't speak to them at all.  Pt has h/o parkinson's.  Pt denies any complaints and is oriented to self but disoriented to time, place, and situation.

## 2020-06-02 NOTE — ED Triage Notes (Signed)
Pt comes into the ED via EMS from [facility], states today the pt would not speak to anyone, pt has a hx of parkinsons, pt has not complaints at this time, VSS

## 2020-06-03 ENCOUNTER — Other Ambulatory Visit: Payer: Self-pay

## 2020-06-03 DIAGNOSIS — E875 Hyperkalemia: Secondary | ICD-10-CM | POA: Diagnosis not present

## 2020-06-03 LAB — BASIC METABOLIC PANEL
Anion gap: 5 (ref 5–15)
Anion gap: 2 — ABNORMAL LOW (ref 5–15)
Anion gap: 5 (ref 5–15)
BUN: 31 mg/dL — ABNORMAL HIGH (ref 8–23)
BUN: 31 mg/dL — ABNORMAL HIGH (ref 8–23)
BUN: 28 mg/dL — ABNORMAL HIGH (ref 8–23)
CO2: 24 mmol/L (ref 22–32)
CO2: 24 mmol/L (ref 22–32)
CO2: 21 mmol/L — ABNORMAL LOW (ref 22–32)
Calcium: 8.8 mg/dL — ABNORMAL LOW (ref 8.9–10.3)
Calcium: 8.8 mg/dL — ABNORMAL LOW (ref 8.9–10.3)
Calcium: 9 mg/dL (ref 8.9–10.3)
Chloride: 111 mmol/L (ref 98–111)
Chloride: 112 mmol/L — ABNORMAL HIGH (ref 98–111)
Chloride: 112 mmol/L — ABNORMAL HIGH (ref 98–111)
Creatinine, Ser: 0.71 mg/dL (ref 0.61–1.24)
Creatinine, Ser: 0.78 mg/dL (ref 0.61–1.24)
Creatinine, Ser: 0.87 mg/dL (ref 0.61–1.24)
GFR, Estimated: >60 mL/min (ref >60)
GFR, Estimated: >60 mL/min (ref >60)
GFR, Estimated: >60 mL/min (ref >60)
Glucose, Bld: 89 mg/dL (ref 70–99)
Glucose, Bld: 134 mg/dL — ABNORMAL HIGH (ref 70–99)
Glucose, Bld: 125 mg/dL — ABNORMAL HIGH (ref 70–99)
Potassium: 6 mmol/L — ABNORMAL HIGH (ref 3.5–5.1)
Potassium: 5.7 mmol/L — ABNORMAL HIGH (ref 3.5–5.1)
Potassium: 5.9 mmol/L — ABNORMAL HIGH (ref 3.5–5.1)
Sodium: 140 mmol/L (ref 135–145)
Sodium: 138 mmol/L (ref 135–145)
Sodium: 138 mmol/L (ref 135–145)

## 2020-06-03 LAB — CBC
HCT: 29.1 % — ABNORMAL LOW (ref 39.0–52.0)
Hemoglobin: 8.9 g/dL — ABNORMAL LOW (ref 13.0–17.0)
MCH: 27.5 pg (ref 26.0–34.0)
MCHC: 30.6 g/dL (ref 30.0–36.0)
MCV: 89.8 fL (ref 80.0–100.0)
Platelets: 122 10*3/uL — ABNORMAL LOW (ref 150–400)
RBC: 3.24 MIL/uL — ABNORMAL LOW (ref 4.22–5.81)
RDW: 16.8 % — ABNORMAL HIGH (ref 11.5–15.5)
WBC: 5.2 10*3/uL (ref 4.0–10.5)
nRBC: 0 % (ref 0.0–0.2)

## 2020-06-03 LAB — IRON AND TIBC
Iron: 25 ug/dL — ABNORMAL LOW (ref 45–182)
Saturation Ratios: 9 % — ABNORMAL LOW (ref 17.9–39.5)
TIBC: 294 ug/dL (ref 250–450)
UIBC: 269 ug/dL

## 2020-06-03 LAB — SAVE SMEAR(SSMR), FOR PROVIDER SLIDE REVIEW

## 2020-06-03 LAB — BLOOD GAS, VENOUS
Acid-base deficit: 2.6 mmol/L — ABNORMAL HIGH (ref 0.0–2.0)
Bicarbonate: 23.7 mmol/L (ref 20.0–28.0)
O2 Saturation: 52.5 %
Patient temperature: 37
pCO2, Ven: 47 mmHg (ref 44.0–60.0)
pH, Ven: 7.31 (ref 7.250–7.430)
pO2, Ven: 31 mmHg — CL (ref 32.0–45.0)

## 2020-06-03 LAB — T4, FREE: Free T4: 0.79 ng/dL (ref 0.61–1.12)

## 2020-06-03 LAB — VITAMIN B12: Vitamin B-12: 404 pg/mL (ref 180–914)

## 2020-06-03 LAB — SARS CORONAVIRUS 2 (TAT 6-24 HRS): SARS Coronavirus 2: NEGATIVE

## 2020-06-03 LAB — CBG MONITORING, ED: Glucose-Capillary: 75 mg/dL (ref 70–99)

## 2020-06-03 LAB — FOLATE: Folate: 7.2 ng/mL (ref >5.9)

## 2020-06-03 LAB — OSMOLALITY: Osmolality: 301 mOsm/kg — ABNORMAL HIGH (ref 275–295)

## 2020-06-03 LAB — FERRITIN: Ferritin: 12 ng/mL — ABNORMAL LOW (ref 24–336)

## 2020-06-03 LAB — TSH: TSH: 8.088 u[IU]/mL — ABNORMAL HIGH (ref 0.350–4.500)

## 2020-06-03 LAB — PATHOLOGIST SMEAR REVIEW

## 2020-06-03 LAB — MAGNESIUM: Magnesium: 2 mg/dL (ref 1.7–2.4)

## 2020-06-03 MED ORDER — SODIUM CHLORIDE 0.9 % IV SOLN: INTRAVENOUS | Status: DC

## 2020-06-03 MED ORDER — ENOXAPARIN SODIUM 40 MG/0.4ML ~~LOC~~ SOLN
40.0000 mg | SUBCUTANEOUS | Status: DC
  Administered 2020-06-03 – 2020-06-12 (×10): 40 mg via SUBCUTANEOUS
  Filled 2020-06-03 (×10): qty 0.4

## 2020-06-03 MED ORDER — INSULIN ASPART 100 UNIT/ML ~~LOC~~ SOLN
SUBCUTANEOUS | Status: AC
  Administered 2020-06-03: 5 [IU] via INTRAVENOUS
  Filled 2020-06-03: qty 1

## 2020-06-03 MED ORDER — DEXTROSE 50 % IV SOLN
1.0000 | Freq: Once | INTRAVENOUS | Status: AC
  Administered 2020-06-03: 50 mL via INTRAVENOUS
  Filled 2020-06-03: qty 50

## 2020-06-03 MED ORDER — INSULIN ASPART 100 UNIT/ML IV SOLN
5.0000 [IU] | Freq: Once | INTRAVENOUS | Status: AC
  Filled 2020-06-03: qty 0.05

## 2020-06-03 MED ORDER — CARBIDOPA-LEVODOPA 25-100 MG PO TABS
1.0000 | ORAL_TABLET | Freq: Three times a day (TID) | ORAL | Status: DC
  Administered 2020-06-03 – 2020-06-13 (×26): 1 via ORAL
  Filled 2020-06-03 (×33): qty 1

## 2020-06-03 NOTE — ED Notes (Signed)
Admitting MD acknowledged repeat K 6. No new orders received at this time.

## 2020-06-03 NOTE — Evaluation (Addendum)
Clinical/Bedside Swallow Evaluation Patient Details  Name: Keith Hicks MRN: [id number] Date of Birth: Feb 27, 1925  Today's Date: 06/03/2020 Time: SLP Start Time (ACUTE ONLY): 0840 SLP Stop Time (ACUTE ONLY): 0940 SLP Time Calculation (min) (ACUTE ONLY): 60 min  Past Medical History:  Past Medical History:  Diagnosis Date  . Hyperlipemia   . Hypertension   . Parkinson's disease [facility])    Past Surgical History: History reviewed. No pertinent surgical history. HPI:  Pt is a 87 y.o. male with medical history significant for Ddementia, Parkinson's disease, hypertension, who is admitted to [facility] on 06/02/2020 with hyperkalemia after presenting from [facility] SNF to [facility] ED for evaluation of altered mental status. In the setting of baseline dementia, the staff at [facility] feel that the patient has been more confused relative to this baseline over the course of the last 2 days.  Imaging: Mild bilateral interstitial prominence, which could reflect mild  edema. Appearance has improved compared to the previous study on 04/19/2020.   Assessment / Plan / Recommendation Clinical Impression  Pt appears to present w/ Mild oropharyngeal phase dysphagia in light of apparent weakness and declined Cognitive status -- pt has Dementia, Parkinson's Dis. Baseline. This impacts his overall awareness/engagement and safety during po tasks which increases risk for aspiration, choking. Pt's risk for aspiration can be reduced when following general aspiration precautions, using a modified diet, and given feeding assistance. He requires Min verbal/visual cues during oral intake and full feeding support(weak, tremorous UEs). Pt consumed trials of single ice chips, purees, and softened solids, then TSP/Cup/straw sips of Nectar liquids w/ No immediate, overt clinical s/s of aspiration noted; no decline in vocal quality during verbalizations, no cough, and no decline in respiratory status during/post trials(O2 sats  99%). Oral phase was adequate for bolus management and oral clearing of the boluses given. He required min extra time w/ the mastication/clearing of increased textured trials. OM Exam revealed No unilateral weakness; min generalized weakness overally noted; tremors. Full feeding support given.   D/t pt's baseline declined Cognitive status, and his risk for aspiration in light of new acute illness and weakness currently, recommend initiation of the dysphagia level Dysphagia level 3(mech soft) w/ Nectar liquids; aspiration precautions; reduce Distractions during meals. Pills Whole in Puree for safer swallowing. Support w/ feeding at meals -- check for oral clearing during/post intake. NSG updated. ST services will f/u w/ pt while admitted for readiness and safety w/ any diet upgrade(thins). Precautions posted in room. SLP Visit Diagnosis: Dysphagia, oropharyngeal phase (R13.12)    Aspiration Risk  Mild aspiration risk;Risk for inadequate nutrition/hydration    Diet Recommendation  Dysphagia level 3 (mech soft) w/ Nectar liquids; aspiration precautions; Support w/ Feeding at meals. Reduce distractions at meals.  Medication Administration: Whole meds with puree (for safer swallowing)    Other  Recommendations Recommended Consults:  (Dietician f/u) Oral Care Recommendations: Oral care BID;Oral care before and after PO;Staff/trained caregiver to provide oral care Other Recommendations: Order thickener from pharmacy;Prohibited food (jello, ice cream, thin soups);Remove water pitcher;Have oral suction available   Follow up Recommendations  (TBD)      Frequency and Duration min 3x week  2 weeks       Prognosis Prognosis for Safe Diet Advancement: Fair Barriers to Reach Goals: Cognitive deficits;Severity of deficits;Time post onset      Swallow Study   General Date of Onset: 06/02/20 HPI: Pt is a 87 y.o. male with medical history significant for Ddementia, Parkinson's disease, hypertension, who  is admitted to [facility] on 06/02/2020 with hyperkalemia after presenting from [facility] SNF to [facility] ED for evaluation of altered mental status. In the setting of baseline dementia, the staff at [facility] feel that the patient has been more confused relative to this baseline over the course of the last 2 days.  Imaging: Mild bilateral interstitial prominence, which could reflect mild  edema. Appearance has improved compared to the previous study on 04/19/2020. Type of Study: Bedside Swallow Evaluation Previous Swallow Assessment: none noted Diet Prior to this Study: NPO Temperature Spikes Noted: No (wbc 5.2) Respiratory Status: Room air History of Recent Intubation: No Behavior/Cognition: Alert;Cooperative;Pleasant mood;Distractible;Requires cueing (baseline Dementia) Oral Cavity Assessment: Dry (min) Oral Care Completed by SLP: Yes Oral Cavity - Dentition: Adequate natural dentition;Missing dentition (few) Vision:  (n/a) Self-Feeding Abilities: Total assist Patient Positioning: Upright in bed (needed positioning) Baseline Vocal Quality: Normal;Low vocal intensity Volitional Cough:  (Fair) Volitional Swallow: Able to elicit    Oral/Motor/Sensory Function Overall Oral Motor/Sensory Function: Generalized oral weakness (min - posterior BOT strength adequate) Facial Symmetry: Within Functional Limits Lingual Symmetry: Within Functional Limits Lingual Strength:  (weaker protrusion) Mandible: Impaired   Ice Chips Ice chips: Within functional limits Presentation: Spoon (4 trials)   Thin Liquid Thin Liquid: Not tested    Nectar Thick Nectar Thick Liquid: Within functional limits Presentation: Cup;Spoon;Straw (supported; ~8 ozs total)   Honey Thick Honey Thick Liquid: Not tested   Puree Puree: Within functional limits Presentation: Spoon (fed; 3 ozs)   Solid     Solid: Impaired (min) Presentation: Spoon (fed; 8 trials) Oral Phase Impairments: Impaired mastication (min) Oral Phase  Functional Implications:  (increased time) Pharyngeal Phase Impairments:  (none)        Jerilynn Som, MS, Science writer Language Pathologist Rehab Services [phone] [facility] 06/03/2020,11:03 AM

## 2020-06-03 NOTE — Progress Notes (Addendum)
PROGRESS NOTE    ADRIANO BISCHOF  IRJ:[id number] DOB: Jun 09, 1924 DOA: 06/02/2020 PCP: Almetta Lovely, [facility]  Outpatient Specialists: none    Brief Narrative:   From admission hpi LARSON LIMONES is a 87 y.o. male with medical history significant for dementia, Parkinson's disease, hypertension, who is admitted to [facility] on 06/02/2020 with hyperkalemia after presenting from [facility] SNF to [facility] ED for evaluation of altered mental status.   In the context of the patient's altered mental status, the following history was obtained via my discussions with the emergency department physician as well as via chart review.  In the setting of baseline dementia, the staff at [facility] feel that the patient has been more confused relative to this baseline over the course of the last 2 days.  They reportedly have not detected significant somnolence over that time, but rather believe that patient is exhibiting worsening confusion over that timeframe.  This is been associated with decline in oral intake over the last 2 to 3 days, in the absence of any reported vomiting or diarrhea.  No evidence of objective fever at SNF.  No reported recent trauma.  No evidence of respiratory distress or cough.  Patient's medical history is notable for history of essential hypertension, for which he is on benazepril as a sole outpatient antihypertensive agent.  In the setting of Parkinson's disease, he is also on levodopa/carbidopa.  Does not appear to be on any testing as an outpatient.   Assessment & Plan:   Principal Problem:   Hyperkalemia Active Problems:   AKI (acute kidney injury) (HCC)   Dehydration   Acute metabolic encephalopathy   Hypertension   Parkinson's disease (HCC)  # Hyperkalemia Acute, was upper limit of normal last month. Suspect 2/2 dehydration (report of decreased po) and acei use. 6.6 on presentation, 6 this morning after insulin, bicarb, calcium gluconate. Not taking by  mouth so potassium binder not ordered. No acidosis, ck wnl. No hyperglycemia. - give another insulin and d50 - serial potassiums - f/u urine sodium, AM renin and aldosterone - maintain telemetry - increase fluids to 100  # AKI Resolved with fluids  # Acute metabolic encephalopathy Baseline parkinsons dementia. ua and cxr not suggestive of infection. CT head w/o acute findings. Hyperkalemia likely contributory, no other electrolyte abnormalities. covid neg. - fluids as above - monitor  # Failed bedside swallow eval - formal SLP eval pending  # Essential hypertension Here bp wnl - holding benazepril as above  # Parkinson's - re-stat sinimet when able to tolerate PO  # Elevated TSH - f/u t4  # Normocytic anemia - f/u iron panel, b12/folate, smear  # Thrombocytopenia Mild - f/u hcv, hiv, smear   DVT prophylaxis: lovenox Code Status: DNR Family Communication: son updated telephonically 3/4  Level of care: Progressive Cardiac Status is: Inpatient  Remains inpatient appropriate because:Inpatient level of care appropriate due to severity of illness   Dispo: The patient is from: SNF              Anticipated d/c is to: SNF              Patient currently is not medically stable to d/c.   Difficult to place patient No        Consultants:  none  Procedures: none  Antimicrobials:  none    Subjective: This morning not very communicative. Says he is cold, denies pain.  Objective: Vitals:   06/03/20 0500 06/03/20 0530 06/03/20 0600 06/03/20 0630  BP: 128/64 126/68 (!) 126/53 136/60  Pulse: 61 62 61 64  Resp: 13 13 13 13   Temp:      TempSrc:      SpO2: 99% 99% 99% 99%  Weight:      Height:        Intake/Output Summary (Last 24 hours) at 06/03/2020 0728 Last data filed at 06/03/2020 0708 Gross per 24 hour  Intake 482.48 ml  Output --  Net 482.48 ml   Filed Weights   06/02/20 1231  Weight: 74.8 kg    Examination:  General exam: Appears calm  and comfortable  Respiratory system: Clear to auscultation. Respiratory effort normal. Cardiovascular system: S1 & S2 heard, RRR. No JVD, murmurs, rubs, gallops or clicks. 1+ pedal edema Gastrointestinal system: Abdomen is nondistended, soft and nontender. No organomegaly or masses felt. Normal bowel sounds heard. Central nervous system: Alert, moving all 4 extremities Extremities: warm Skin: hyperkeratotic plaques face Psychiatry: not agitated    Data Reviewed: I have personally reviewed following labs and imaging studies  CBC: Recent Labs  Lab 06/02/20 1234 06/03/20 0439  WBC 6.0 5.2  HGB 10.4* 8.9*  HCT 33.2* 29.1*  MCV 89.0 89.8  PLT 144* 122*   Basic Metabolic Panel: Recent Labs  Lab 06/02/20 1234 06/02/20 1917 06/02/20 2306 06/03/20 0439  NA 135 135 139 140  K 6.6* 6.7* 5.4* 6.0*  CL 108 110 110 111  CO2 21* 21* 22 24  GLUCOSE 89 81 122* 89  BUN 39* 38* 35* 31*  CREATININE 1.05 0.93 0.81 0.71  CALCIUM 9.3 9.0 8.9 8.8*  MG  --  2.2  --  2.0   GFR: Estimated Creatinine Clearance: 59.6 mL/min (by C-G formula based on SCr of 0.71 mg/dL). Liver Function Tests: Recent Labs  Lab 06/02/20 1234  AST 27  ALT 15  ALKPHOS 120  BILITOT 0.5  PROT 7.0  ALBUMIN 3.7   No results for input(s): LIPASE, AMYLASE in the last 168 hours. No results for input(s): AMMONIA in the last 168 hours. Coagulation Profile: No results for input(s): INR, PROTIME in the last 168 hours. Cardiac Enzymes: Recent Labs  Lab 06/02/20 1917  CKTOTAL 33*   BNP (last 3 results) No results for input(s): PROBNP in the last 8760 hours. HbA1C: No results for input(s): HGBA1C in the last 72 hours. CBG: Recent Labs  Lab 06/02/20 2107  GLUCAP 154*   Lipid Profile: No results for input(s): CHOL, HDL, LDLCALC, TRIG, CHOLHDL, LDLDIRECT in the last 72 hours. Thyroid Function Tests: Recent Labs    06/03/20 0439  TSH 8.088*   Anemia Panel: No results for input(s): VITAMINB12, FOLATE,  FERRITIN, TIBC, IRON, RETICCTPCT in the last 72 hours. Urine analysis:    Component Value Date/Time   COLORURINE YELLOW (A) 06/02/2020 1822   APPEARANCEUR CLEAR (A) 06/02/2020 1822   LABSPEC 1.016 06/02/2020 1822   PHURINE 5.0 06/02/2020 1822   GLUCOSEU NEGATIVE 06/02/2020 1822   HGBUR NEGATIVE 06/02/2020 1822   BILIRUBINUR NEGATIVE 06/02/2020 1822   KETONESUR NEGATIVE 06/02/2020 1822   PROTEINUR NEGATIVE 06/02/2020 1822   NITRITE NEGATIVE 06/02/2020 1822   LEUKOCYTESUR NEGATIVE 06/02/2020 1822   Sepsis Labs: @LABRCNTIP (procalcitonin:4,lacticidven:4)  ) Recent Results (from the past 240 hour(s))  SARS CORONAVIRUS 2 (TAT 6-24 HRS) Nasopharyngeal Nasopharyngeal Swab     Status: None   Collection Time: 06/02/20  2:45 PM   Specimen: Nasopharyngeal Swab  Result Value Ref Range Status   SARS Coronavirus 2 NEGATIVE NEGATIVE Final    Comment: (  NOTE) SARS-CoV-2 target nucleic acids are NOT DETECTED.  The SARS-CoV-2 RNA is generally detectable in upper and lower respiratory specimens during the acute phase of infection. Negative results do not preclude SARS-CoV-2 infection, do not rule out co-infections with other pathogens, and should not be used as the sole basis for treatment or other patient management decisions. Negative results must be combined with clinical observations, patient history, and epidemiological information. The expected result is Negative.  Fact Sheet for Patients: HairSlick.no  Fact Sheet for Healthcare Providers: quierodirigir.com  This test is not yet approved or cleared by the [place] FDA and  has been authorized for detection and/or diagnosis of SARS-CoV-2 by FDA under an Emergency Use Authorization (EUA). This EUA will remain  in effect (meaning this test can be used) for the duration of the COVID-19 declaration under Se ction 564(b)(1) of the Act, 21 U.S.C. section 360bbb-3(b)(1), unless the  authorization is terminated or revoked sooner.  Performed at [facility], [address], [place], [place] [address]          Radiology Studies: CT Head Wo Contrast  Result Date: 06/02/2020 CLINICAL DATA:  Mental status change EXAM: CT HEAD WITHOUT CONTRAST TECHNIQUE: Contiguous axial images were obtained from the base of the skull through the vertex without intravenous contrast. COMPARISON:  04/19/2020 FINDINGS: Brain: Mild atrophy.  Mild white matter hypodensity bilaterally. Negative for acute infarct, hemorrhage, mass. Vascular: Negative for hyperdense vessel. Skull: Negative Sinuses/Orbits: Paranasal sinuses clear. Mild right mastoid tip effusion. Left mastoid clear. Negative orbit Other: None IMPRESSION: No acute abnormality no change from the prior study. Mild atrophy and mild chronic microvascular ischemic change in the white matter. Electronically Signed   By: Marlan Palau M.D.   On: 06/02/2020 14:15   DG Chest Portable 1 View  Result Date: 06/02/2020 CLINICAL DATA:  Altered mental status EXAM: PORTABLE CHEST 1 VIEW COMPARISON:  04/19/2020 FINDINGS: Stable cardiomediastinal contours. Atherosclerotic calcification of the aortic knob. Previously seen bilateral airspace opacities have resolved. Minimal persistent interstitial prominence. No pleural effusion or pneumothorax. IMPRESSION: Mild bilateral interstitial prominence, which could reflect mild edema. Appearance has improved compared to the previous study. Electronically Signed   By: Duanne Guess D.O.   On: 06/02/2020 14:39        Scheduled Meds: . insulin aspart  5 Units Intravenous Once   And  . dextrose  1 ampule Intravenous Once   Continuous Infusions: . sodium chloride 75 mL/hr at 06/03/20 0239     LOS: 1 day    Time spent: 40 min    Silvano Bilis, MD [facility]   If 7PM-7AM, please contact night-coverage [email or web address] Password [facility] 06/03/2020, 7:28 AM

## 2020-06-03 NOTE — ED Notes (Signed)
Medications administered per order. Pt tolerated well. Repeat blood work collected by this Charity fundraiser. Pt repositioned in bed at this time. Pt denies further needs at this time.

## 2020-06-03 NOTE — ED Notes (Signed)
Admitting MD messaged regarding repeat potassium on morning labs increasing to 6, awaiting orders at this time. Pt visualized resting in bed with eyes closed, respirations even and unlabored at this time.

## 2020-06-03 NOTE — ED Notes (Signed)
Admitting MD notified that per SLP patient passed swallow screen.

## 2020-06-03 NOTE — ED Notes (Signed)
Son at bedside. Reheated breakfast and set up tray. Patient able to eat without assistance.

## 2020-06-03 NOTE — ED Notes (Signed)
SLP at bedside to perform bedside swallow.

## 2020-06-03 NOTE — ED Notes (Signed)
Report received from [place], [place]. This RN assumes care of patient at this time.

## 2020-06-04 DIAGNOSIS — E875 Hyperkalemia: Secondary | ICD-10-CM | POA: Diagnosis not present

## 2020-06-04 LAB — BASIC METABOLIC PANEL
Anion gap: 3 — ABNORMAL LOW (ref 5–15)
Anion gap: 3 — ABNORMAL LOW (ref 5–15)
Anion gap: 4 — ABNORMAL LOW (ref 5–15)
BUN: 25 mg/dL — ABNORMAL HIGH (ref 8–23)
BUN: 25 mg/dL — ABNORMAL HIGH (ref 8–23)
BUN: 27 mg/dL — ABNORMAL HIGH (ref 8–23)
CO2: 20 mmol/L — ABNORMAL LOW (ref 22–32)
CO2: 21 mmol/L — ABNORMAL LOW (ref 22–32)
CO2: 22 mmol/L (ref 22–32)
Calcium: 8.6 mg/dL — ABNORMAL LOW (ref 8.9–10.3)
Calcium: 8.7 mg/dL — ABNORMAL LOW (ref 8.9–10.3)
Calcium: 9.2 mg/dL (ref 8.9–10.3)
Chloride: 111 mmol/L (ref 98–111)
Chloride: 112 mmol/L — ABNORMAL HIGH (ref 98–111)
Chloride: 114 mmol/L — ABNORMAL HIGH (ref 98–111)
Creatinine, Ser: 0.7 mg/dL (ref 0.61–1.24)
Creatinine, Ser: 0.71 mg/dL (ref 0.61–1.24)
Creatinine, Ser: 0.77 mg/dL (ref 0.61–1.24)
GFR, Estimated: >60 mL/min (ref >60)
GFR, Estimated: >60 mL/min (ref >60)
GFR, Estimated: >60 mL/min (ref >60)
Glucose, Bld: 69 mg/dL — ABNORMAL LOW (ref 70–99)
Glucose, Bld: 78 mg/dL (ref 70–99)
Glucose, Bld: 94 mg/dL (ref 70–99)
Potassium: 5.7 mmol/L — ABNORMAL HIGH (ref 3.5–5.1)
Potassium: 5.7 mmol/L — ABNORMAL HIGH (ref 3.5–5.1)
Potassium: 6.1 mmol/L — ABNORMAL HIGH (ref 3.5–5.1)
Sodium: 136 mmol/L (ref 135–145)
Sodium: 136 mmol/L (ref 135–145)
Sodium: 138 mmol/L (ref 135–145)

## 2020-06-04 LAB — GLUCOSE, CAPILLARY
Glucose-Capillary: 120 mg/dL — ABNORMAL HIGH (ref 70–99)
Glucose-Capillary: 97 mg/dL (ref 70–99)

## 2020-06-04 LAB — SODIUM, URINE, RANDOM: Sodium, Ur: 107 mmol/L

## 2020-06-04 LAB — HEPATITIS C ANTIBODY: HCV Ab: NONREACTIVE

## 2020-06-04 LAB — HIV ANTIBODY (ROUTINE TESTING W REFLEX): HIV Screen 4th Generation wRfx: NONREACTIVE

## 2020-06-04 MED ORDER — DEXTROSE 50 % IV SOLN
INTRAVENOUS | Status: AC
  Filled 2020-06-04: qty 50

## 2020-06-04 MED ORDER — INSULIN ASPART 100 UNIT/ML IV SOLN
10.0000 [IU] | Freq: Once | INTRAVENOUS | Status: AC
  Administered 2020-06-04: 10 [IU] via INTRAVENOUS
  Filled 2020-06-04 (×2): qty 0.1

## 2020-06-04 MED ORDER — DEXTROSE 50 % IV SOLN
1.0000 | Freq: Once | INTRAVENOUS | Status: AC
  Administered 2020-06-04: 50 mL via INTRAVENOUS
  Filled 2020-06-04: qty 50

## 2020-06-04 MED ORDER — PATIROMER SORBITEX CALCIUM 8.4 G PO PACK
8.4000 g | PACK | Freq: Every day | ORAL | Status: DC
  Administered 2020-06-04 – 2020-06-06 (×3): 8.4 g via ORAL
  Filled 2020-06-04 (×3): qty 1

## 2020-06-04 MED ORDER — FERROUS SULFATE 325 (65 FE) MG PO TABS
325.0000 mg | ORAL_TABLET | ORAL | Status: DC
  Administered 2020-06-04 – 2020-06-12 (×5): 325 mg via ORAL
  Filled 2020-06-04 (×5): qty 1

## 2020-06-04 NOTE — Progress Notes (Addendum)
Pt alert and oriented x3, on room air, afebrile. Pt bradycardic this shift without symptoms. Provider notified. BG with AM labs 69. 240cc apple juice given. Falls precautions remained in place  06/04/20 0421  Vitals  Temp 97.8 F (36.6 C)  BP 137/61  MAP (mmHg) 82  BP Location Left Leg  BP Method Automatic  Patient Position (if appropriate) Lying  Pulse Rate (!) 45  Pulse Rate Source Monitor  Resp 18  MEWS COLOR  MEWS Score Color Green  Oxygen Therapy  SpO2 100 %  O2 Device Room Air  MEWS Score  MEWS Temp 0  MEWS Systolic 0  MEWS Pulse 1  MEWS RR 0  MEWS LOC 0  MEWS Score 1

## 2020-06-04 NOTE — Progress Notes (Signed)
PROGRESS NOTE    Keith Hicks  [name] DOB: 10/20/1924 DOA: 06/02/2020 PCP: Almetta Lovely, [facility]  Outpatient Specialists: none    Brief Narrative:   From admission hpi Keith Hicks is a 87 y.o. male with medical history significant for dementia, Parkinson's disease, hypertension, who is admitted to [facility] on 06/02/2020 with hyperkalemia after presenting from [facility] to [facility] ED for evaluation of altered mental status.   In the context of the patient's altered mental status, the following history was obtained via my discussions with the emergency department physician as well as via chart review.  In the setting of baseline dementia, the staff at [facility] feel that the patient has been more confused relative to this baseline over the course of the last 2 days.  They reportedly have not detected significant somnolence over that time, but rather believe that patient is exhibiting worsening confusion over that timeframe.  This is been associated with decline in oral intake over the last 2 to 3 days, in the absence of any reported vomiting or diarrhea.  No evidence of objective fever at SNF.  No reported recent trauma.  No evidence of respiratory distress or cough.  Patient's medical history is notable for history of essential hypertension, for which he is on benazepril as a sole outpatient antihypertensive agent.  In the setting of Parkinson's disease, he is also on levodopa/carbidopa.  Does not appear to be on any testing as an outpatient.   Assessment & Plan:   Principal Problem:   Hyperkalemia Active Problems:   AKI (acute kidney injury) (HCC)   Dehydration   Acute metabolic encephalopathy   Hypertension   Parkinson's disease (HCC)  # Hyperkalemia Acute, was upper limit of normal last month. Suspect 2/2 dehydration (report of decreased po) and acei use. 6.6 on presentation, 5.7 this morning after insulin, bicarb, calcium gluconate.  - start  veltassa - serial potassiums - f/u urine sodium, AM renin and aldosterone - maintain telemetry - continue fluids  # AKI Resolved with fluids  # Acute metabolic encephalopathy Baseline parkinsons dementia. ua and cxr not suggestive of infection. CT head w/o acute findings. Hyperkalemia likely contributory, no other electrolyte abnormalities. covid neg. Appears to be resolved - fluids as above - monitor  # Essential hypertension Here bp wnl - holding benazepril as above  # Parkinson's - re-start sinimet   # Subclinical hypothyroidism - defer starting tx given age  # Normocytic anemia # Iron deficiency anemia Iron deficient, unremarkable smear and b12/folate. No report of hematochezia or melena. - start oral iron - monitor hgb  # Thrombocytopenia Mild, stable - f/u hcv, hiv. Smear unremarkable   DVT prophylaxis: lovenox Code Status: DNR Family Communication: son updated telephonically 3/5  Level of care: Progressive Cardiac Status is: Inpatient  Remains inpatient appropriate because:Inpatient level of care appropriate due to severity of illness   Dispo: The patient is from: SNF              Anticipated d/c is to: SNF              Patient currently is not medically stable to d/c.   Difficult to place patient No        Consultants:  none  Procedures: none  Antimicrobials:  none    Subjective: This morning awake, alert. Denies pain or palpitations. Tolerating diet.  Objective: Vitals:   06/04/20 0033 06/04/20 0114 06/04/20 0421 06/04/20 0746  BP: 126/60  137/61 132/65  Pulse: Marland Kitchen)  57  (!) 45 (!) 52  Resp: 19  18 16   Temp:   97.8 F (36.6 C) 97.7 F (36.5 C)  TempSrc:      SpO2: 99%  100% 98%  Weight:  77.7 kg    Height:        Intake/Output Summary (Last 24 hours) at 06/04/2020 1034 Last data filed at 06/04/2020 1003 Gross per 24 hour  Intake 360 ml  Output 300 ml  Net 60 ml   Filed Weights   06/02/20 1231 06/04/20 0114  Weight: 74.8  kg 77.7 kg    Examination:  General exam: Appears calm and comfortable  Respiratory system: Clear to auscultation. Respiratory effort normal. Cardiovascular system: S1 & S2 heard, RRR. No JVD, murmurs, rubs, gallops or clicks. 1+ pedal edema Gastrointestinal system: Abdomen is nondistended, soft and nontender. No organomegaly or masses felt. Normal bowel sounds heard. Central nervous system: Alert, moving all 4 extremities Extremities: warm Skin: hyperkeratotic plaques face Psychiatry: not agitated     Data Reviewed: I have personally reviewed following labs and imaging studies  CBC: Recent Labs  Lab 06/02/20 1234 06/03/20 0439  WBC 6.0 5.2  HGB 10.4* 8.9*  HCT 33.2* 29.1*  MCV 89.0 89.8  PLT 144* 122*   Basic Metabolic Panel: Recent Labs  Lab 06/02/20 1917 06/02/20 2306 06/03/20 0439 06/03/20 1217 06/03/20 1816 06/03/20 2355 06/04/20 0442  NA 135   < > 140 138 138 136 138  K 6.7*   < > 6.0* 5.7* 5.9* 5.7* 5.7*  CL 110   < > 111 112* 112* 112* 114*  CO2 21*   < > 24 24 21* 20* 21*  GLUCOSE 81   < > 89 134* 125* 78 69*  BUN 38*   < > 31* 31* 28* 27* 25*  CREATININE 0.93   < > 0.71 0.78 0.87 0.71 0.70  CALCIUM 9.0   < > 8.8* 8.8* 9.0 8.6* 8.7*  MG 2.2  --  2.0  --   --   --   --    < > = values in this interval not displayed.   GFR: Estimated Creatinine Clearance: 59.6 mL/min (by C-G formula based on SCr of 0.7 mg/dL). Liver Function Tests: Recent Labs  Lab 06/02/20 1234  AST 27  ALT 15  ALKPHOS 120  BILITOT 0.5  PROT 7.0  ALBUMIN 3.7   No results for input(s): LIPASE, AMYLASE in the last 168 hours. No results for input(s): AMMONIA in the last 168 hours. Coagulation Profile: No results for input(s): INR, PROTIME in the last 168 hours. Cardiac Enzymes: Recent Labs  Lab 06/02/20 1917  CKTOTAL 33*   BNP (last 3 results) No results for input(s): PROBNP in the last 8760 hours. HbA1C: No results for input(s): HGBA1C in the last 72  hours. CBG: Recent Labs  Lab 06/02/20 2107 06/03/20 0738 06/04/20 0641  GLUCAP 154* 75 120*   Lipid Profile: No results for input(s): CHOL, HDL, LDLCALC, TRIG, CHOLHDL, LDLDIRECT in the last 72 hours. Thyroid Function Tests: Recent Labs    06/03/20 0439 06/03/20 0745  TSH 8.088*  --   FREET4  --  0.79   Anemia Panel: Recent Labs    06/03/20 0739 06/03/20 0745  VITAMINB12 404  --   FOLATE  --  7.2  FERRITIN  --  12*  TIBC  --  294  IRON  --  25*   Urine analysis:    Component Value Date/Time   COLORURINE YELLOW (A)  06/02/2020 1822   APPEARANCEUR CLEAR (A) 06/02/2020 1822   LABSPEC 1.016 06/02/2020 1822   PHURINE 5.0 06/02/2020 1822   GLUCOSEU NEGATIVE 06/02/2020 1822   HGBUR NEGATIVE 06/02/2020 1822   BILIRUBINUR NEGATIVE 06/02/2020 1822   KETONESUR NEGATIVE 06/02/2020 1822   PROTEINUR NEGATIVE 06/02/2020 1822   NITRITE NEGATIVE 06/02/2020 1822   LEUKOCYTESUR NEGATIVE 06/02/2020 1822   Sepsis Labs: @LABRCNTIP (procalcitonin:4,lacticidven:4)  ) Recent Results (from the past 240 hour(s))  SARS CORONAVIRUS 2 (TAT 6-24 HRS) Nasopharyngeal Nasopharyngeal Swab     Status: None   Collection Time: 06/02/20  2:45 PM   Specimen: Nasopharyngeal Swab  Result Value Ref Range Status   SARS Coronavirus 2 NEGATIVE NEGATIVE Final    Comment: (NOTE) SARS-CoV-2 target nucleic acids are NOT DETECTED.  The SARS-CoV-2 RNA is generally detectable in upper and lower respiratory specimens during the acute phase of infection. Negative results do not preclude SARS-CoV-2 infection, do not rule out co-infections with other pathogens, and should not be used as the sole basis for treatment or other patient management decisions. Negative results must be combined with clinical observations, patient history, and epidemiological information. The expected result is Negative.  Fact Sheet for Patients: 08/02/20  Fact Sheet for Healthcare  Providers: HairSlick.no  This test is not yet approved or cleared by the quierodirigir.com FDA and  has been authorized for detection and/or diagnosis of SARS-CoV-2 by FDA under an Emergency Use Authorization (EUA). This EUA will remain  in effect (meaning this test can be used) for the duration of the COVID-19 declaration under Se ction 564(b)(1) of the Act, 21 U.S.C. section 360bbb-3(b)(1), unless the authorization is terminated or revoked sooner.  Performed at [facility], [address] [place], [place]          Radiology Studies: CT Head Wo Contrast  Result Date: 06/02/2020 CLINICAL DATA:  Mental status change EXAM: CT HEAD WITHOUT CONTRAST TECHNIQUE: Contiguous axial images were obtained from the base of the skull through the vertex without intravenous contrast. COMPARISON:  04/19/2020 FINDINGS: Brain: Mild atrophy.  Mild white matter hypodensity bilaterally. Negative for acute infarct, hemorrhage, mass. Vascular: Negative for hyperdense vessel. Skull: Negative Sinuses/Orbits: Paranasal sinuses clear. Mild right mastoid tip effusion. Left mastoid clear. Negative orbit Other: None IMPRESSION: No acute abnormality no change from the prior study. Mild atrophy and mild chronic microvascular ischemic change in the white matter. Electronically Signed   By: 04/21/2020 M.D.   On: 06/02/2020 14:15   DG Chest Portable 1 View  Result Date: 06/02/2020 CLINICAL DATA:  Altered mental status EXAM: PORTABLE CHEST 1 VIEW COMPARISON:  04/19/2020 FINDINGS: Stable cardiomediastinal contours. Atherosclerotic calcification of the aortic knob. Previously seen bilateral airspace opacities have resolved. Minimal persistent interstitial prominence. No pleural effusion or pneumothorax. IMPRESSION: Mild bilateral interstitial prominence, which could reflect mild edema. Appearance has improved compared to the previous study. Electronically Signed   By: 04/21/2020 D.O.   On: 06/02/2020 14:39        Scheduled Meds: . carbidopa-levodopa  1 tablet Oral TID AC  . enoxaparin (LOVENOX) injection  40 mg Subcutaneous Q24H  . patiromer  8.4 g Oral Daily   Continuous Infusions: . sodium chloride 100 mL/hr at 06/04/20 0046     LOS: 2 days    Time spent: 30 min    08/04/20, MD [facility]   If 7PM-7AM, please contact night-coverage [email or web address] Password [facility] 06/04/2020, 10:34 AM

## 2020-06-04 NOTE — Progress Notes (Signed)
  Speech Language Pathology Treatment: Dysphagia  Patient Details Name: Keith Hicks MRN: [id number] DOB: 09-Aug-1924 Today's Date: 06/04/2020 Time: 1015-1030 SLP Time Calculation (min) (ACUTE ONLY): 15 min  Assessment / Plan / Recommendation Clinical Impression  Skilled ST services focused on diet texture analysis and education. Patient presenting upright in bed, awake/alert, and agreeable to skilled ST services today. SLP assisted patient with oral care prior to PO intake. Given minimal feeding assistance, patient consumed x3-4 sips of thin liquids via straw with intermittent, immediate coughing episodes. Given no feeding assistance with verbal cues to "take small sips", patient consumed ~4oz thins liquids via cup sips without any anterior oral spillage, coughing/choking episodes, and/or reported globus sensations. At this time, SLP recommends a diet upgrade to Mechanical Soft (NDD3) and Thin liquids via cup only (no straw); Continue safe swallowing strategies and recommended supervision/assistance with meals from evaluating SLP (I.e. small bites/sips, slow rate, sit upright, oral care, etc). Patient verbalized understanding and agreement to education, although suspected decreased comprehension d/t hx of dementia. SLP to notify RN of diet changes and recommendations. Patient was left upright in bed with call bell in reach and all wants/needs met prior to exiting room. Continue current POC.   HPI HPI: Pt is a 87 y.o. male with medical history significant for dementia, Parkinson's disease, hypertension, who is admitted to [facility] on 06/02/2020 with hyperkalemia after presenting from [facility] to [facility] ED for evaluation of altered mental status. In the setting of baseline dementia, the staff at [facility] feel that the patient has been more confused relative to this baseline over the course of the last 2 days.  Imaging: Mild bilateral interstitial prominence, which could reflect mild  edema.  Appearance has improved compared to the previous study on 04/19/2020.      SLP Plan  Goals updated       Recommendations  Diet recommendations: Dysphagia 3 (mechanical soft);Thin liquid Liquids provided via: Cup;No straw Medication Administration: Other (Comment) (Per MD/RN discretion) Supervision: Patient able to self feed;Full supervision/cueing for compensatory strategies Compensations: Minimize environmental distractions;Slow rate;Small sips/bites Postural Changes and/or Swallow Maneuvers: Seated upright 90 degrees                Oral Care Recommendations: Staff/trained caregiver to provide oral care Follow up Recommendations: Other (comment) (TBD) SLP Visit Diagnosis: Dysphagia, oropharyngeal phase (R13.12) - (suspected at bedside) Plan: Goals updated       GO                Loni Beckwith 06/04/2020, 11:01 AM   Loni Beckwith, M.S. [place] Speech-Language Pathologist

## 2020-06-05 ENCOUNTER — Encounter: Payer: Self-pay | Admitting: Internal Medicine

## 2020-06-05 ENCOUNTER — Inpatient Hospital Stay: Payer: Medicare Other

## 2020-06-05 DIAGNOSIS — E875 Hyperkalemia: Secondary | ICD-10-CM | POA: Diagnosis not present

## 2020-06-05 LAB — BASIC METABOLIC PANEL
Anion gap: 3 — ABNORMAL LOW (ref 5–15)
Anion gap: 3 — ABNORMAL LOW (ref 5–15)
BUN: 23 mg/dL (ref 8–23)
BUN: 24 mg/dL — ABNORMAL HIGH (ref 8–23)
CO2: 21 mmol/L — ABNORMAL LOW (ref 22–32)
CO2: 20 mmol/L — ABNORMAL LOW (ref 22–32)
Calcium: 8.9 mg/dL (ref 8.9–10.3)
Calcium: 8.6 mg/dL — ABNORMAL LOW (ref 8.9–10.3)
Chloride: 115 mmol/L — ABNORMAL HIGH (ref 98–111)
Chloride: 115 mmol/L — ABNORMAL HIGH (ref 98–111)
Creatinine, Ser: 0.65 mg/dL (ref 0.61–1.24)
Creatinine, Ser: 0.77 mg/dL (ref 0.61–1.24)
GFR, Estimated: >60 mL/min (ref >60)
GFR, Estimated: >60 mL/min (ref >60)
Glucose, Bld: 72 mg/dL (ref 70–99)
Glucose, Bld: 103 mg/dL — ABNORMAL HIGH (ref 70–99)
Potassium: 5.8 mmol/L — ABNORMAL HIGH (ref 3.5–5.1)
Potassium: 5.9 mmol/L — ABNORMAL HIGH (ref 3.5–5.1)
Sodium: 139 mmol/L (ref 135–145)
Sodium: 138 mmol/L (ref 135–145)

## 2020-06-05 LAB — LACTIC ACID, PLASMA
Lactic Acid, Venous: 1.5 mmol/L (ref 0.5–1.9)
Lactic Acid, Venous: 1.4 mmol/L (ref 0.5–1.9)

## 2020-06-05 LAB — CBC
HCT: 28.7 % — ABNORMAL LOW (ref 39.0–52.0)
HCT: 26.9 % — ABNORMAL LOW (ref 39.0–52.0)
Hemoglobin: 9.2 g/dL — ABNORMAL LOW (ref 13.0–17.0)
Hemoglobin: 8.4 g/dL — ABNORMAL LOW (ref 13.0–17.0)
MCH: 28 pg (ref 26.0–34.0)
MCH: 28 pg (ref 26.0–34.0)
MCHC: 32.1 g/dL (ref 30.0–36.0)
MCHC: 31.2 g/dL (ref 30.0–36.0)
MCV: 87.5 fL (ref 80.0–100.0)
MCV: 89.7 fL (ref 80.0–100.0)
Platelets: 114 10*3/uL — ABNORMAL LOW (ref 150–400)
Platelets: 120 10*3/uL — ABNORMAL LOW (ref 150–400)
RBC: 3.28 MIL/uL — ABNORMAL LOW (ref 4.22–5.81)
RBC: 3 MIL/uL — ABNORMAL LOW (ref 4.22–5.81)
RDW: 16.9 % — ABNORMAL HIGH (ref 11.5–15.5)
RDW: 17.1 % — ABNORMAL HIGH (ref 11.5–15.5)
WBC: 3.8 10*3/uL — ABNORMAL LOW (ref 4.0–10.5)
WBC: 3.7 10*3/uL — ABNORMAL LOW (ref 4.0–10.5)
nRBC: 0 % (ref 0.0–0.2)
nRBC: 0 % (ref 0.0–0.2)

## 2020-06-05 LAB — DIFFERENTIAL
Abs Immature Granulocytes: 0.01 10*3/uL (ref 0.00–0.07)
Basophils Absolute: 0 10*3/uL (ref 0.0–0.1)
Basophils Relative: 0 %
Eosinophils Absolute: 0.1 10*3/uL (ref 0.0–0.5)
Eosinophils Relative: 2 %
Immature Granulocytes: 0 %
Lymphocytes Relative: 13 %
Lymphs Abs: 0.5 10*3/uL — ABNORMAL LOW (ref 0.7–4.0)
Monocytes Absolute: 0.3 10*3/uL (ref 0.1–1.0)
Monocytes Relative: 7 %
Neutro Abs: 2.8 10*3/uL (ref 1.7–7.7)
Neutrophils Relative %: 78 %

## 2020-06-05 LAB — URINALYSIS, COMPLETE (UACMP) WITH MICROSCOPIC
Bilirubin Urine: NEGATIVE
Glucose, UA: NEGATIVE mg/dL
Hgb urine dipstick: NEGATIVE
Ketones, ur: NEGATIVE mg/dL
Leukocytes,Ua: NEGATIVE
Nitrite: NEGATIVE
Protein, ur: NEGATIVE mg/dL
Specific Gravity, Urine: 1.008 (ref 1.005–1.030)
Squamous Epithelial / HPF: NONE SEEN (ref 0–5)
pH: 7 (ref 5.0–8.0)

## 2020-06-05 LAB — MRSA PCR SCREENING: MRSA by PCR: POSITIVE — AB

## 2020-06-05 LAB — NA AND K (SODIUM & POTASSIUM), RAND UR
Potassium Urine: 28 mmol/L
Sodium, Ur: 81 mmol/L

## 2020-06-05 LAB — CALCIUM, IONIZED: Calcium, Ionized, Serum: 4.8 mg/dL (ref 4.5–5.6)

## 2020-06-05 LAB — CREATININE, URINE, RANDOM: Creatinine, Urine: 29 mg/dL

## 2020-06-05 LAB — FIBRINOGEN: Fibrinogen: 362 mg/dL (ref 210–475)

## 2020-06-05 LAB — OSMOLALITY, URINE: Osmolality, Ur: 365 mOsm/kg (ref 300–900)

## 2020-06-05 LAB — CK: Total CK: 42 U/L — ABNORMAL LOW (ref 49–397)

## 2020-06-05 LAB — LIPASE, BLOOD: Lipase: 38 U/L (ref 11–51)

## 2020-06-05 LAB — TROPONIN I (HIGH SENSITIVITY): Troponin I (High Sensitivity): 7 ng/L (ref <18)

## 2020-06-05 LAB — CORTISOL: Cortisol, Plasma: 12.3 ug/dL

## 2020-06-05 MED ORDER — PIPERACILLIN-TAZOBACTAM 3.375 G IVPB
3.3750 g | Freq: Three times a day (TID) | INTRAVENOUS | Status: DC
  Administered 2020-06-05 – 2020-06-06 (×3): 3.375 g via INTRAVENOUS
  Filled 2020-06-05 (×3): qty 50

## 2020-06-05 MED ORDER — SODIUM CHLORIDE 0.9 % IV BOLUS
500.0000 mL | Freq: Once | INTRAVENOUS | Status: AC
  Administered 2020-06-05: 500 mL via INTRAVENOUS

## 2020-06-05 MED ORDER — VANCOMYCIN HCL 2000 MG/400ML IV SOLN
2000.0000 mg | Freq: Once | INTRAVENOUS | Status: AC
  Administered 2020-06-05: 2000 mg via INTRAVENOUS
  Filled 2020-06-05: qty 400

## 2020-06-05 MED ORDER — VANCOMYCIN HCL 1500 MG/300ML IV SOLN
1500.0000 mg | INTRAVENOUS | Status: DC
  Administered 2020-06-06: 1500 mg via INTRAVENOUS
  Filled 2020-06-05: qty 300

## 2020-06-05 NOTE — Progress Notes (Signed)
Pharmacy Antibiotic Note  Keith Hicks is a 87 y.o. male admitted on 06/02/2020 with PMH of dementia, Parkinson's disease, hypertension originally admitted for hyperkalemia now with new leukopenia and hypotension. Pharmacy has been consulted for vancomycin and Zosyn dosing. Renal function is stable and at apparent baseline  Plan:  1) start Zosyn 3.375g IV q8h (4 hour infusion)  2) start vancomycin 2000 mg IV x 1 then 1500 mg IV every 24 hrs  Goal AUC 400-550  Expected AUC: 520.1  SCr used: 0.80 mg/dL (rounded up)  Ke: 9.935 h-1, T1/2: 12.9 h  Daily SCr while on IV vancomycin  Levels as clinically indicated   Height: 5\' 10"  (177.8 cm) Weight: 74.4 kg (164 lb) IBW/kg (Calculated) : 73  Temp (24hrs), Avg:96.3 F (35.7 C), Min:93.9 F (34.4 C), Max:98.1 F (36.7 C)  Recent Labs  Lab 06/02/20 1234 06/02/20 1917 06/02/20 2033 06/02/20 2306 06/03/20 0439 06/03/20 1217 06/03/20 2355 06/04/20 0442 06/04/20 1753 06/05/20 0505 06/05/20 1114  WBC 6.0  --   --   --  5.2  --   --   --   --  3.8*  --   CREATININE 1.05   < >  --    < > 0.71   < > 0.71 0.70 0.77 0.65 0.77  LATICACIDVEN  --   --  1.0  --   --   --   --   --   --   --   --    < > = values in this interval not displayed.    Estimated Creatinine Clearance: 59.6 mL/min (by C-G formula based on SCr of 0.77 mg/dL).    No Known Allergies  Antimicrobials this admission: vancomycin 03/06 >>  Zosyn 03/06 >>   Microbiology results: 03/06 BCx: pending 03/06 UCx: pending  03/06 MRSA PCR: pending 03/03 SARS CoV-2: negative  Thank you for allowing pharmacy to be a part of this patient's care.  05/03 06/05/2020 11:46 AM

## 2020-06-05 NOTE — Consult Note (Signed)
[facility]  CONSULT NOTE    Date: 06/05/2020                  Patient Name:  Keith Hicks  MRN: [id number]  DOB: [birth year]  Age / Sex: 87 y.o., male         PCP: [facility]                 Service Requesting Consult: Dr. Ashok Pall                 Reason for Consult: Hyperkalemia            History of Present Illness: Mr. Keith Hicks admitted with altered mental status and found to have hyperkalemia. He has not been eating and having worsening confusion. He has been taking his benazepril, his only prescribed medication.   Son and granddaughter at bedside. They state he is doing better since being admitted.   Patient was found to have a serum potassium of 6.6 and started on IV fluids. However potassium has no improved despite IV fluids and potassium binders.    Medications: Outpatient medications: Medications Prior to Admission  Medication Sig Dispense Refill Last Dose  . acetaminophen (TYLENOL) 650 MG CR tablet Take 650 mg by mouth 3 (three) times daily.   unknown at prn  . benazepril (LOTENSIN) 10 MG tablet Take 15 mg by mouth in the morning.   06/02/2020 at 0800  . carbidopa-levodopa (SINEMET IR) 25-100 MG tablet Take 1 tablet by mouth 3 (three) times daily before meals.   06/02/2020 at 0700  . diclofenac Sodium (VOLTAREN) 1 % GEL Apply 4 g topically 2 (two) times daily.   06/02/2020 at 0700    Current medications: Current Facility-Administered Medications  Medication Dose Route Frequency Provider Last Rate Last Admin  . 0.9 %  sodium chloride infusion   Intravenous Continuous Kathrynn Running, MD 100 mL/hr at 06/05/20 0205 New Bag at 06/05/20 0205  . carbidopa-levodopa (SINEMET IR) 25-100 MG per tablet immediate release 1 tablet  1 tablet Oral TID AC Wouk, Wilfred Curtis, MD   1 tablet at 06/05/20 1202  . enoxaparin (LOVENOX) injection 40 mg  40 mg Subcutaneous Q24H Kathrynn Running, MD   40 mg at 06/04/20 1943  . ferrous sulfate tablet  325 mg  325 mg Oral Q48H Wouk, Wilfred Curtis, MD   325 mg at 06/04/20 1055  . patiromer Lelon Perla) packet 8.4 g  8.4 g Oral Daily Kathrynn Running, MD   8.4 g at 06/05/20 1115  . piperacillin-tazobactam (ZOSYN) IVPB 3.375 g  3.375 g Intravenous Q8H Lowella Bandy, RPH      . [START ON 06/06/2020] vancomycin (VANCOREADY) IVPB 1500 mg/300 mL  1,500 mg Intravenous Q24H Lowella Bandy, RPH      . vancomycin (VANCOREADY) IVPB 2000 mg/400 mL  2,000 mg Intravenous Once Lowella Bandy, RPH          Allergies: No Known Allergies    Past Medical History: Past Medical History:  Diagnosis Date  . Hyperlipemia   . Hypertension   . Parkinson's disease [facility])      Past Surgical History: History reviewed. No pertinent surgical history.   Family History: History reviewed. No pertinent family history.   Social History: Social History   Socioeconomic History  . Marital status: Married    Spouse name: Not on file  . Number of children: Not on file  . Years of education:  Not on file  . Highest education level: Not on file  Occupational History  . Not on file  Tobacco Use  . Smoking status: Unknown If Ever Smoked  . Smokeless tobacco: Never Used  Substance and Sexual Activity  . Alcohol use: Not Currently  . Drug use: Never  . Sexual activity: Not Currently  Other Topics Concern  . Not on file  Social History Narrative  . Not on file   Social Determinants of Health   Financial Resource Strain: Not on file  Food Insecurity: Not on file  Transportation Needs: Not on file  Physical Activity: Not on file  Stress: Not on file  Social Connections: Not on file  Intimate Partner Violence: Not on file     Review of Systems: Review of Systems  Unable to perform ROS: Dementia    Vital Signs: Blood pressure (!) 104/41, pulse 64, temperature 98.1 F (36.7 C), resp. rate 16, height 5\' 10"  (1.778 m), weight 74.4 kg, SpO2 94 %.  Weight trends: Filed Weights   06/02/20 1231  06/04/20 0114 06/05/20 0325  Weight: 74.8 kg 77.7 kg 74.4 kg    Physical Exam: General: NAD, frail  Head: Normocephalic, atraumatic. Moist oral mucosal membranes  Eyes: Anicteric, PERRL  Neck: Supple, trachea midline  Lungs:  Clear to auscultation  Heart: Regular rate and rhythm  Abdomen:  Soft, nontender,   Extremities:  no peripheral edema.  Neurologic: Answers questions slowly, recognizes granddaughter but not son at bedside.   Skin: No lesions        Lab results: Basic Metabolic Panel: Recent Labs  Lab 06/02/20 1917 06/02/20 2306 06/03/20 0439 06/03/20 1217 06/04/20 1753 06/05/20 0505 06/05/20 1114  NA 135   < > 140   < > 136 139 138  K 6.7*   < > 6.0*   < > 6.1* 5.8* 5.9*  CL 110   < > 111   < > 111 115* 115*  CO2 21*   < > 24   < > 22 21* 20*  GLUCOSE 81   < > 89   < > 94 72 103*  BUN 38*   < > 31*   < > 25* 23 24*  CREATININE 0.93   < > 0.71   < > 0.77 0.65 0.77  CALCIUM 9.0   < > 8.8*   < > 9.2 8.9 8.6*  MG 2.2  --  2.0  --   --   --   --    < > = values in this interval not displayed.    Liver Function Tests: Recent Labs  Lab 06/02/20 1234  AST 27  ALT 15  ALKPHOS 120  BILITOT 0.5  PROT 7.0  ALBUMIN 3.7   Recent Labs  Lab 06/05/20 1114  LIPASE 38   No results for input(s): AMMONIA in the last 168 hours.  CBC: Recent Labs  Lab 06/02/20 1234 06/03/20 0439 06/05/20 0505 06/05/20 1114  WBC 6.0 5.2 3.8* 3.7*  NEUTROABS  --   --   --  2.8  HGB 10.4* 8.9* 9.2* 8.4*  HCT 33.2* 29.1* 28.7* 26.9*  MCV 89.0 89.8 87.5 89.7  PLT 144* 122* 114* 120*    Cardiac Enzymes: Recent Labs  Lab 06/02/20 1917 06/05/20 1114  CKTOTAL 33* 42*    BNP: Invalid input(s): POCBNP  CBG: Recent Labs  Lab 06/02/20 2107 06/03/20 0738 06/04/20 0641 06/04/20 1941  GLUCAP 154* 75 120* 97    Microbiology: Results for orders  placed or performed during the hospital encounter of 06/02/20  SARS CORONAVIRUS 2 (TAT 6-24 HRS) Nasopharyngeal Nasopharyngeal  Swab     Status: None   Collection Time: 06/02/20  2:45 PM   Specimen: Nasopharyngeal Swab  Result Value Ref Range Status   SARS Coronavirus 2 NEGATIVE NEGATIVE Final    Comment: (NOTE) SARS-CoV-2 target nucleic acids are NOT DETECTED.  The SARS-CoV-2 RNA is generally detectable in upper and lower respiratory specimens during the acute phase of infection. Negative results do not preclude SARS-CoV-2 infection, do not rule out co-infections with other pathogens, and should not be used as the sole basis for treatment or other patient management decisions. Negative results must be combined with clinical observations, patient history, and epidemiological information. The expected result is Negative.  Fact Sheet for Patients: HairSlick.no  Fact Sheet for Healthcare Providers: quierodirigir.com  This test is not yet approved or cleared by the [place] FDA and  has been authorized for detection and/or diagnosis of SARS-CoV-2 by FDA under an Emergency Use Authorization (EUA). This EUA will remain  in effect (meaning this test can be used) for the duration of the COVID-19 declaration under Se ction 564(b)(1) of the Act, 21 U.S.C. section 360bbb-3(b)(1), unless the authorization is terminated or revoked sooner.  Performed at [facility], [address], [place], [place] [address]     Coagulation Studies: No results for input(s): LABPROT, INR in the last 72 hours.  Urinalysis: Recent Labs    06/02/20 1822 06/05/20 1124  COLORURINE YELLOW* YELLOW*  LABSPEC 1.016 1.008  PHURINE 5.0 7.0  GLUCOSEU NEGATIVE NEGATIVE  HGBUR NEGATIVE NEGATIVE  BILIRUBINUR NEGATIVE NEGATIVE  KETONESUR NEGATIVE NEGATIVE  PROTEINUR NEGATIVE NEGATIVE  NITRITE NEGATIVE NEGATIVE  LEUKOCYTESUR NEGATIVE NEGATIVE      Imaging:  No results found.   Assessment & Plan: Mr. BREVAN LUBERTO is a 87 y.o. white male with Parkinson's,  dementia, hypertension, hyperlipidemia, who was admitted to [facility] on 06/02/2020 for Hyperkalemia [E87.5]  1. Acute kidney injury with persistent hyperkalemia:  Differential does include adrenal insufficiency, renal tubular acidosis type IV, iatrogenic or another cause. Acute kidney injury improved with holding benazepril and IV fluids Unclear if hyperkalemia is residual from acute kidney injury. - discontinue benazepril - Continue potassium binders - Continue IV fluids - check renal ultrasound - check urine electrolytes.   2. Hypertension: blood pressure at goal to low. 104/41. Holding benazepril.       LOS: 3 Sarath Kolluru 3/6/202212:34 PM

## 2020-06-05 NOTE — Plan of Care (Signed)
Pt alert and oriented x3, forgetful. Pt hypothermic and Bair hugger applied. NS @100ml /hr. External cath Problem: Clinical Measurements: Goal: Will remain free from infection Outcome: Progressing   Problem: Pain Managment: Goal: General experience of comfort will improve Outcome: Progressing   Problem: Safety: Goal: Ability to remain free from injury will improve Outcome: Progressing   Problem: Skin Integrity: Goal: Risk for impaired skin integrity will decrease Outcome: Progressing

## 2020-06-05 NOTE — Progress Notes (Signed)
   06/05/20 0044  Assess: MEWS Score  Temp (!) 93.9 F (34.4 C)  Assess: MEWS Score  MEWS Temp 2  MEWS Systolic 0  MEWS Pulse 0  MEWS RR 0  MEWS LOC 0  MEWS Score 2  MEWS Score Color Yellow  Assess: if the MEWS score is Yellow or Red  Were vital signs taken at a resting state? Yes  Focused Assessment Change from prior assessment (see assessment flowsheet)  MEWS guidelines implemented *See Row Information* No, vital signs rechecked  Treat  MEWS Interventions Other (Comment)  Pain Scale 0-10  Pain Score 0  Escalate  MEWS: Escalate Yellow: discuss with charge nurse/RN and consider discussing with provider and RRT  Notify: Charge Nurse/RN  Name of Charge Nurse/RN Notified Forensic scientist  Date Charge Nurse/RN Notified 06/05/20  Time Charge Nurse/RN Notified 0104  Document  Patient Outcome Stabilized after interventions  Progress note created (see row info) Yes  Bair hugger applied for hypothermia, will update intervention

## 2020-06-05 NOTE — Consult Note (Signed)
WOC Nurse Consult Note: Reason for Consult: Left lateral malleolus Stage 3 pressure injury, POA. Wound type: Pressure Pressure Injury POA: Yes Measurement: 1.5cm x 1cm x 0.2cm (Measured by Bedside RN, Marzella Schlein.) Wound bed:100% red, moist Drainage (amount, consistency, odor) Small serous Periwound: Mild erythema Dressing procedure/placement/frequency: Conservative guidance for daily topical care of this full thickness wound is provided using an antimicrobial nonadherent dressing (Xeroform) along with measures to promote wound healing i.e., a Prevalon boot that corrects for lateral rotation.   WOC nursing team will not follow, but will remain available to this patient, the nursing and medical teams.  Please re-consult if needed. Thanks, Ladona Mow, MSN, RN, GNP, [facility] [place]  Pager# [phone]

## 2020-06-05 NOTE — Progress Notes (Addendum)
PROGRESS NOTE    Keith Hicks  HEN:[id number] DOB: [birth year] DOA: 06/02/2020 PCP: Almetta Lovely, [facility]  Outpatient Specialists: none    Brief Narrative:   From admission hpi Keith Hicks is a 87 y.o. male with medical history significant for dementia, Parkinson's disease, hypertension, who is admitted to [facility] on 06/02/2020 with hyperkalemia after presenting from [facility] SNF to [facility] ED for evaluation of altered mental status.   In the context of the patient's altered mental status, the following history was obtained via my discussions with the emergency department physician as well as via chart review.  In the setting of baseline dementia, the staff at [facility] feel that the patient has been more confused relative to this baseline over the course of the last 2 days.  They reportedly have not detected significant somnolence over that time, but rather believe that patient is exhibiting worsening confusion over that timeframe.  This is been associated with decline in oral intake over the last 2 to 3 days, in the absence of any reported vomiting or diarrhea.  No evidence of objective fever at SNF.  No reported recent trauma.  No evidence of respiratory distress or cough.  Patient's medical history is notable for history of essential hypertension, for which he is on benazepril as a sole outpatient antihypertensive agent.  In the setting of Parkinson's disease, he is also on levodopa/carbidopa.  Does not appear to be on any testing as an outpatient.   Assessment & Plan:   Principal Problem:   Hyperkalemia Active Problems:   AKI (acute kidney injury) (HCC)   Dehydration   Acute metabolic encephalopathy   Hypertension   Parkinson's disease (HCC)  # Hyperkalemia Acute, was upper limit of normal last month. Suspect 2/2 dehydration (report of decreased po) and acei use. 6.6 on presentation, 4.8 this morning after insulin, bicarb, calcium gluconate. Required more  insulin last night - started veltassa - serial potassiums - renin, aldosterone, cortisol pending - maintain telemetry - continue fluids - nephrology consulted as etiology of hyperkalemia remains unclear  # Hypothermia # Hypotension # Leukopenia New this morning. MAP of 60. On bear hugger. Non-focal exam - f/u blood cultures, cxr, repeat urinalysis w/ culture, troponin, ekg, diff, lipase, ck, lactate, fibrinogen - will also start empiric vanc/zosyn, check mrsa screen - 500 cc bolus ordered, q1 vitals  # AKI Resolved with fluids  # Acute metabolic encephalopathy Baseline parkinsons dementia. ua and cxr not suggestive of infection. CT head w/o acute findings. Hyperkalemia likely contributory, no other electrolyte abnormalities. covid neg. Mental status improved but not at baseline. - fluids as above - monitor  # Essential hypertension Here bp wnl - holding benazepril as above  # Parkinson's - re-started sinimet   # Subclinical hypothyroidism - defer starting tx given age  # Normocytic anemia # Iron deficiency anemia Iron deficient, unremarkable smear and b12/folate. No report of hematochezia or melena. Hgb stable - started oral iron - monitor hgb  # Thrombocytopenia Mild, stable. Hiv, hcv, smear unremarkable - monitor  # Pressure ulcer right heal Bandaged   DVT prophylaxis: lovenox Code Status: DNR Family Communication: son updated bedside 3/6  Level of care: Progressive Cardiac Status is: Inpatient  Remains inpatient appropriate because:Inpatient level of care appropriate due to severity of illness   Dispo: The patient is from: SNF              Anticipated d/c is to: SNF  Patient currently is not medically stable to d/c.   Difficult to place patient No        Consultants:  none  Procedures: none  Antimicrobials:  none    Subjective: This morning less alert than yesterday. Did eat some breakfast. No  complaints  Objective: Vitals:   06/04/20 2141 06/05/20 0044 06/05/20 0325 06/05/20 0816  BP: (!) 118/51  (!) 130/52 (!) 104/41  Pulse: (!) 54  (!) 56 64  Resp: 20  18 16   Temp:  (!) 93.9 F (34.4 C) (!) 94.2 F (34.6 C) 98.1 F (36.7 C)  TempSrc:  Rectal Rectal   SpO2: 99%  99% 94%  Weight:   74.4 kg   Height:        Intake/Output Summary (Last 24 hours) at 06/05/2020 1057 Last data filed at 06/05/2020 0340 Gross per 24 hour  Intake 1668.31 ml  Output 3500 ml  Net -1831.69 ml   Filed Weights   06/02/20 1231 06/04/20 0114 06/05/20 0325  Weight: 74.8 kg 77.7 kg 74.4 kg    Examination:  General exam: Appears calm and comfortable  Respiratory system: Clear to auscultation. Respiratory effort normal. Cardiovascular system: S1 & S2 heard, RRR. No JVD, murmurs, rubs, gallops or clicks. 1+ pedal edema Gastrointestinal system: Abdomen is nondistended, soft and nontender. No organomegaly or masses felt. Normal bowel sounds heard. Central nervous system: Alert, moving all 4 extremities Extremities: warm Skin: hyperkeratotic plaques face. Bandaged ulcer right heel Psychiatry: not agitated     Data Reviewed: I have personally reviewed following labs and imaging studies  CBC: Recent Labs  Lab 06/02/20 1234 06/03/20 0439 06/05/20 0505  WBC 6.0 5.2 3.8*  HGB 10.4* 8.9* 9.2*  HCT 33.2* 29.1* 28.7*  MCV 89.0 89.8 87.5  PLT 144* 122* 114*   Basic Metabolic Panel: Recent Labs  Lab 06/02/20 1917 06/02/20 2306 06/03/20 0439 06/03/20 1217 06/03/20 1816 06/03/20 2355 06/04/20 0442 06/04/20 1753 06/05/20 0505  NA 135   < > 140   < > 138 136 138 136 139  K 6.7*   < > 6.0*   < > 5.9* 5.7* 5.7* 6.1* 5.8*  CL 110   < > 111   < > 112* 112* 114* 111 115*  CO2 21*   < > 24   < > 21* 20* 21* 22 21*  GLUCOSE 81   < > 89   < > 125* 78 69* 94 72  BUN 38*   < > 31*   < > 28* 27* 25* 25* 23  CREATININE 0.93   < > 0.71   < > 0.87 0.71 0.70 0.77 0.65  CALCIUM 9.0   < > 8.8*   < > 9.0  8.6* 8.7* 9.2 8.9  MG 2.2  --  2.0  --   --   --   --   --   --    < > = values in this interval not displayed.   GFR: Estimated Creatinine Clearance: 59.6 mL/min (by C-G formula based on SCr of 0.65 mg/dL). Liver Function Tests: Recent Labs  Lab 06/02/20 1234  AST 27  ALT 15  ALKPHOS 120  BILITOT 0.5  PROT 7.0  ALBUMIN 3.7   No results for input(s): LIPASE, AMYLASE in the last 168 hours. No results for input(s): AMMONIA in the last 168 hours. Coagulation Profile: No results for input(s): INR, PROTIME in the last 168 hours. Cardiac Enzymes: Recent Labs  Lab 06/02/20 1917  CKTOTAL 33*  BNP (last 3 results) No results for input(s): PROBNP in the last 8760 hours. HbA1C: No results for input(s): HGBA1C in the last 72 hours. CBG: Recent Labs  Lab 06/02/20 2107 06/03/20 0738 06/04/20 0641 06/04/20 1941  GLUCAP 154* 75 120* 97   Lipid Profile: No results for input(s): CHOL, HDL, LDLCALC, TRIG, CHOLHDL, LDLDIRECT in the last 72 hours. Thyroid Function Tests: Recent Labs    06/03/20 0439 06/03/20 0745  TSH 8.088*  --   FREET4  --  0.79   Anemia Panel: Recent Labs    06/03/20 0739 06/03/20 0745  VITAMINB12 404  --   FOLATE  --  7.2  FERRITIN  --  12*  TIBC  --  294  IRON  --  25*   Urine analysis:    Component Value Date/Time   COLORURINE YELLOW (A) 06/02/2020 1822   APPEARANCEUR CLEAR (A) 06/02/2020 1822   LABSPEC 1.016 06/02/2020 1822   PHURINE 5.0 06/02/2020 1822   GLUCOSEU NEGATIVE 06/02/2020 1822   HGBUR NEGATIVE 06/02/2020 1822   BILIRUBINUR NEGATIVE 06/02/2020 1822   KETONESUR NEGATIVE 06/02/2020 1822   PROTEINUR NEGATIVE 06/02/2020 1822   NITRITE NEGATIVE 06/02/2020 1822   LEUKOCYTESUR NEGATIVE 06/02/2020 1822   Sepsis Labs: @LABRCNTIP (procalcitonin:4,lacticidven:4)  ) Recent Results (from the past 240 hour(s))  SARS CORONAVIRUS 2 (TAT 6-24 HRS) Nasopharyngeal Nasopharyngeal Swab     Status: None   Collection Time: 06/02/20  2:45 PM    Specimen: Nasopharyngeal Swab  Result Value Ref Range Status   SARS Coronavirus 2 NEGATIVE NEGATIVE Final    Comment: (NOTE) SARS-CoV-2 target nucleic acids are NOT DETECTED.  The SARS-CoV-2 RNA is generally detectable in upper and lower respiratory specimens during the acute phase of infection. Negative results do not preclude SARS-CoV-2 infection, do not rule out co-infections with other pathogens, and should not be used as the sole basis for treatment or other patient management decisions. Negative results must be combined with clinical observations, patient history, and epidemiological information. The expected result is Negative.  Fact Sheet for Patients: 08/02/20  Fact Sheet for Healthcare Providers: HairSlick.no  This test is not yet approved or cleared by the quierodirigir.com FDA and  has been authorized for detection and/or diagnosis of SARS-CoV-2 by FDA under an Emergency Use Authorization (EUA). This EUA will remain  in effect (meaning this test can be used) for the duration of the COVID-19 declaration under Se ction 564(b)(1) of the Act, 21 U.S.C. section 360bbb-3(b)(1), unless the authorization is terminated or revoked sooner.  Performed at [facility], [address], [place], [place]          Radiology Studies: No results found.      Scheduled Meds: . carbidopa-levodopa  1 tablet Oral TID AC  . enoxaparin (LOVENOX) injection  40 mg Subcutaneous Q24H  . ferrous sulfate  325 mg Oral Q48H  . patiromer  8.4 g Oral Daily   Continuous Infusions: . sodium chloride 100 mL/hr at 06/05/20 0205     LOS: 3 days    Time spent: 30 min    08/05/20, MD [facility]   If 7PM-7AM, please contact night-coverage [email or web address] Password [facility] 06/05/2020, 10:57 AM

## 2020-06-06 DIAGNOSIS — E875 Hyperkalemia: Secondary | ICD-10-CM | POA: Diagnosis not present

## 2020-06-06 LAB — CBC WITH DIFFERENTIAL/PLATELET
Abs Immature Granulocytes: 0.01 10*3/uL (ref 0.00–0.07)
Basophils Absolute: 0 10*3/uL (ref 0.0–0.1)
Basophils Relative: 0 %
Eosinophils Absolute: 0.1 10*3/uL (ref 0.0–0.5)
Eosinophils Relative: 2 %
HCT: 28.8 % — ABNORMAL LOW (ref 39.0–52.0)
Hemoglobin: 8.9 g/dL — ABNORMAL LOW (ref 13.0–17.0)
Immature Granulocytes: 0 %
Lymphocytes Relative: 18 %
Lymphs Abs: 0.9 10*3/uL (ref 0.7–4.0)
MCH: 27.5 pg (ref 26.0–34.0)
MCHC: 30.9 g/dL (ref 30.0–36.0)
MCV: 88.9 fL (ref 80.0–100.0)
Monocytes Absolute: 0.6 10*3/uL (ref 0.1–1.0)
Monocytes Relative: 12 %
Neutro Abs: 3.5 10*3/uL (ref 1.7–7.7)
Neutrophils Relative %: 68 %
Platelets: 121 10*3/uL — ABNORMAL LOW (ref 150–400)
RBC: 3.24 MIL/uL — ABNORMAL LOW (ref 4.22–5.81)
RDW: 17 % — ABNORMAL HIGH (ref 11.5–15.5)
WBC: 5.3 10*3/uL (ref 4.0–10.5)
nRBC: 0 % (ref 0.0–0.2)

## 2020-06-06 LAB — BASIC METABOLIC PANEL
Anion gap: 3 — ABNORMAL LOW (ref 5–15)
BUN: 27 mg/dL — ABNORMAL HIGH (ref 8–23)
CO2: 21 mmol/L — ABNORMAL LOW (ref 22–32)
Calcium: 8.9 mg/dL (ref 8.9–10.3)
Chloride: 116 mmol/L — ABNORMAL HIGH (ref 98–111)
Creatinine, Ser: 1 mg/dL (ref 0.61–1.24)
GFR, Estimated: >60 mL/min (ref >60)
Glucose, Bld: 85 mg/dL (ref 70–99)
Potassium: 5.1 mmol/L (ref 3.5–5.1)
Sodium: 140 mmol/L (ref 135–145)

## 2020-06-06 LAB — URINE CULTURE

## 2020-06-06 MED ORDER — VANCOMYCIN HCL 1250 MG/250ML IV SOLN: 1250.0000 mg | INTRAVENOUS | Status: DC

## 2020-06-06 MED ORDER — DEXTROSE-NACL 5-0.45 % IV SOLN: INTRAVENOUS | Status: DC

## 2020-06-06 NOTE — Care Management Important Message (Signed)
Important Message  Patient Details  Name: Keith Hicks MRN: [id number] Date of Birth: 27-Oct-1924   Medicare Important Message Given:  Yes     [name] 06/06/2020, 12:25 PM

## 2020-06-06 NOTE — Progress Notes (Signed)
  Speech Language Pathology Treatment: Dysphagia  Patient Details Name: Keith Hicks MRN: [id number] DOB: Sep 22, 1924 Today's Date: 06/06/2020 Time: 7654-6503 SLP Time Calculation (min) (ACUTE ONLY): 40 min  Assessment / Plan / Recommendation Clinical Impression  Patient seen for follow-up for dysphagia. Per RN has been more confused. Coughing with pancakes and thin liquids this morning, was able to tolerate grits. He has been unable to self feed. Patient was awake but confused, looking up to the ceiling, not responding appropriately to questions. With oral care he was able to attend to simple commands briefly. SLP assisted with feeding items from tray (dysphagia 3, thin liquids). Pt needed total assist for self-feeding; could not assist in holding cup or any items from tray even with hand-over-hand. Wet vocal quality after sips of thin liquids due to reduced awareness of bolus, apparent delay in swallow initiation. Required cues intermittently to initiate swallow, but tolerated sips of nectar and teaspoons of soup without overt signs of aspiration. Did not make any attempt to bite or otherwise manipulate solids; these had to be removed. Usual max cues for sustained attention to Pos necessary. Given current mental status, overt signs of aspiration with thin, and inability to self-feed, recommend downgrade to dysphagia 1 (puree) and nectar thick liquids, meds crushed. Will need full supervision/assistance.     HPI HPI: Pt is a 87 y.o. male with medical history significant for dementia, Parkinson's disease, hypertension, who is admitted to [facility] on 06/02/2020 with hyperkalemia after presenting from [facility] SNF to [facility] ED for evaluation of altered mental status. In the setting of baseline dementia, the staff at [facility] feel that the patient has been more confused relative to this baseline over the course of the last 2 days.  Imaging: Mild bilateral interstitial prominence, which could  reflect mild  edema. Appearance has improved compared to the previous study on 04/19/2020.      SLP Plan  Continue with current plan of care (downgrade to d1/nectar)       Recommendations  Diet recommendations: Dysphagia 1 (puree);Nectar-thick liquid Liquids provided via: Cup;No straw Medication Administration: Crushed with puree Supervision: Full supervision/cueing for compensatory strategies Compensations: Minimize environmental distractions;Slow rate;Small sips/bites Postural Changes and/or Swallow Maneuvers: Seated upright 90 degrees                Plan: Continue with current plan of care (downgrade to d1/nectar)       GO              Rondel Baton, MS, CCC-SLP Speech-Language Pathologist    Arlana Lindau 06/06/2020, 5:20 PM

## 2020-06-06 NOTE — Progress Notes (Signed)
Pharmacy Antibiotic Note  Keith Hicks is a 87 y.o. male admitted on 06/02/2020 with PMH of dementia, Parkinson's disease, hypertension originally admitted for hyperkalemia now with new leukopenia and hypotension. Pharmacy has been consulted for vancomycin and Zosyn dosing. WBC WNL and pt remains afeb.   Plan:  1) continue Zosyn 3.375g IV q8h (4 hour infusion)  2) pt received vancomycin 2000 mg IV x 1 then 1500 mg IV every 24 hrs. Will adjust dose to 1250 mg q24H due to bump in Scr. Continue to monitor Scr and plan to order level in 2-3 days if vancomycin continued.   Goal AUC 400-550  Expected AUC: 508  SCr used: 1 mg/dL   Height: 5\' 10"  (177.8 cm) Weight: 77.7 kg (171 lb 4.8 oz) IBW/kg (Calculated) : 73  Temp (24hrs), Avg:98.1 F (36.7 C), Min:97.2 F (36.2 C), Max:98.5 F (36.9 C)  Recent Labs  Lab 06/02/20 1234 06/02/20 1917 06/02/20 2033 06/02/20 2306 06/03/20 0439 06/03/20 1217 06/04/20 0442 06/04/20 1753 06/05/20 0505 06/05/20 1114 06/05/20 1501 06/06/20 0425  WBC 6.0  --   --   --  5.2  --   --   --  3.8* 3.7*  --  5.3  CREATININE 1.05   < >  --    < > 0.71   < > 0.70 0.77 0.65 0.77  --  1.00  LATICACIDVEN  --   --  1.0  --   --   --   --   --   --  1.5 1.4  --    < > = values in this interval not displayed.    Estimated Creatinine Clearance: 47.7 mL/min (by C-G formula based on SCr of 1 mg/dL).    No Known Allergies  Antimicrobials this admission: vancomycin 03/06 >>  Zosyn 03/06 >>   Microbiology results: 03/06 BCx: pending 03/06 UCx:   MULTIPLE SPECIES PRESENT, SUGGEST RECOLLECTION   03/06 MRSA PCR: positive 03/03 SARS CoV-2: negative  Thank you for allowing pharmacy to be a part of this patient's care.  05/03, PharmD  06/06/2020 8:47 AM

## 2020-06-06 NOTE — Progress Notes (Addendum)
PROGRESS NOTE    Keith Hicks  HAL:[id number] DOB: 1924/11/22 DOA: 06/02/2020 PCP: Almetta Lovely, [facility]  Outpatient Specialists: none    Brief Narrative:   From admission hpi Keith Hicks is a 87 y.o. male with medical history significant for dementia, Parkinson's disease, hypertension, who is admitted to [facility] on 06/02/2020 with hyperkalemia after presenting from [facility] SNF to [facility] ED for evaluation of altered mental status.   In the context of the patient's altered mental status, the following history was obtained via my discussions with the emergency department physician as well as via chart review.  In the setting of baseline dementia, the staff at [facility] feel that the patient has been more confused relative to this baseline over the course of the last 2 days.  They reportedly have not detected significant somnolence over that time, but rather believe that patient is exhibiting worsening confusion over that timeframe.  This is been associated with decline in oral intake over the last 2 to 3 days, in the absence of any reported vomiting or diarrhea.  No evidence of objective fever at SNF.  No reported recent trauma.  No evidence of respiratory distress or cough.  Patient's medical history is notable for history of essential hypertension, for which he is on benazepril as a sole outpatient antihypertensive agent.  In the setting of Parkinson's disease, he is also on levodopa/carbidopa.  Does not appear to be on any testing as an outpatient.   Assessment & Plan:   Principal Problem:   Hyperkalemia Active Problems:   AKI (acute kidney injury) (HCC)   Dehydration   Acute metabolic encephalopathy   Hypertension   Parkinson's disease (HCC)  # Hyperkalemia Acute, was upper limit of normal last month. Suspect 2/2 dehydration (report of decreased po) and acei use. 6.6 on presentation. Received calcium, bicarb, and several rounds of insulin w/ dextrose.  Nephrology consulted 3/6 for persistent hyperkalemia. This morning k much improved to 5.1. cortisol wnl, renal u/s with medical renal disease 4.8 this morning after insulin, bicarb, calcium gluconate. Required more insulin last night - received veltassa again this AM, will d/c - will keep fluids going for another day - renin, aldosterone pending - maintain telemetry - will f/u additional nephrology recs when available - holding home acei, topical nsaid  # Hypothermia # Hypotension # Leukopenia New the morning of 3/6. Resolved. Infectious w/u negative. Given empiric vanc/zosyn which will d/c. MAP of 60. On bear hugger. Non-focal exam - monitor  # AKI Resolved with fluids  # Acute metabolic encephalopathy Baseline parkinsons dementia. ua and cxr not suggestive of infection. CT head w/o acute findings. Hyperkalemia likely contributory, no other electrolyte abnormalities. covid neg. Mental status improved but not at baseline. - monitor  # Essential hypertension Here bp wnl - holding benazepril as above  # Parkinson's - re-started sinimet   # Subclinical hypothyroidism - defer starting tx given age  # Normocytic anemia # Iron deficiency anemia Iron deficient, unremarkable smear and b12/folate. No report of hematochezia or melena. Hgb stable - started oral iron - monitor hgb  # Thrombocytopenia Mild, stable. Hiv, hcv, smear unremarkable - monitor  # Pressure ulcer right heal Bandaged, wound nurse following   DVT prophylaxis: lovenox Code Status: DNR Family Communication: son updated telephonically 3/7  Level of care: Progressive Cardiac Status is: Inpatient  Remains inpatient appropriate because:Inpatient level of care appropriate due to severity of illness   Dispo: The patient is from: SNF  Anticipated d/c is to: SNF              Patient currently is not medically stable to d/c.   Difficult to place patient No        Consultants:   none  Procedures: none  Antimicrobials:  none    Subjective: This morning awake, minimally responsive.   Objective: Vitals:   06/06/20 0100 06/06/20 0437 06/06/20 0732 06/06/20 1134  BP: (!) 134/58 (!) 148/73 136/76 (!) 147/71  Pulse: 67 63 60 61  Resp: 16 18 16 14   Temp: 98.5 F (36.9 C) 98.3 F (36.8 C) 98.4 F (36.9 C) 98.1 F (36.7 C)  TempSrc:  Oral Axillary Axillary  SpO2: 95% 98% 97% 98%  Weight: 77.7 kg     Height:        Intake/Output Summary (Last 24 hours) at 06/06/2020 1157 Last data filed at 06/06/2020 1045 Gross per 24 hour  Intake 784.94 ml  Output 2850 ml  Net -2065.06 ml   Filed Weights   06/04/20 0114 06/05/20 0325 06/06/20 0100  Weight: 77.7 kg 74.4 kg 77.7 kg    Examination:  General exam: Appears calm and comfortable  Respiratory system: Clear to auscultation. Respiratory effort normal. Cardiovascular system: S1 & S2 heard, RRR. No JVD, murmurs, rubs, gallops or clicks. 1+ pedal edema Gastrointestinal system: Abdomen is nondistended, soft and nontender. No organomegaly or masses felt. Normal bowel sounds heard. Central nervous system: awake, moving all 4 extremities Extremities: warm Skin: hyperkeratotic plaques face. Bandaged ulcer right heel Psychiatry: not agitated     Data Reviewed: I have personally reviewed following labs and imaging studies  CBC: Recent Labs  Lab 06/02/20 1234 06/03/20 0439 06/05/20 0505 06/05/20 1114 06/06/20 0425  WBC 6.0 5.2 3.8* 3.7* 5.3  NEUTROABS  --   --   --  2.8 3.5  HGB 10.4* 8.9* 9.2* 8.4* 8.9*  HCT 33.2* 29.1* 28.7* 26.9* 28.8*  MCV 89.0 89.8 87.5 89.7 88.9  PLT 144* 122* 114* 120* 121*   Basic Metabolic Panel: Recent Labs  Lab 06/02/20 1917 06/02/20 2306 06/03/20 0439 06/03/20 1217 06/04/20 0442 06/04/20 1753 06/05/20 0505 06/05/20 1114 06/06/20 0425  NA 135   < > 140   < > 138 136 139 138 140  K 6.7*   < > 6.0*   < > 5.7* 6.1* 5.8* 5.9* 5.1  CL 110   < > 111   < > 114* 111  115* 115* 116*  CO2 21*   < > 24   < > 21* 22 21* 20* 21*  GLUCOSE 81   < > 89   < > 69* 94 72 103* 85  BUN 38*   < > 31*   < > 25* 25* 23 24* 27*  CREATININE 0.93   < > 0.71   < > 0.70 0.77 0.65 0.77 1.00  CALCIUM 9.0   < > 8.8*   < > 8.7* 9.2 8.9 8.6* 8.9  MG 2.2  --  2.0  --   --   --   --   --   --    < > = values in this interval not displayed.   GFR: Estimated Creatinine Clearance: 47.7 mL/min (by C-G formula based on SCr of 1 mg/dL). Liver Function Tests: Recent Labs  Lab 06/02/20 1234  AST 27  ALT 15  ALKPHOS 120  BILITOT 0.5  PROT 7.0  ALBUMIN 3.7   Recent Labs  Lab 06/05/20 1114  LIPASE 38  No results for input(s): AMMONIA in the last 168 hours. Coagulation Profile: No results for input(s): INR, PROTIME in the last 168 hours. Cardiac Enzymes: Recent Labs  Lab 06/02/20 1917 06/05/20 1114  CKTOTAL 33* 42*   BNP (last 3 results) No results for input(s): PROBNP in the last 8760 hours. HbA1C: No results for input(s): HGBA1C in the last 72 hours. CBG: Recent Labs  Lab 06/02/20 2107 06/03/20 0738 06/04/20 0641 06/04/20 1941  GLUCAP 154* 75 120* 97   Lipid Profile: No results for input(s): CHOL, HDL, LDLCALC, TRIG, CHOLHDL, LDLDIRECT in the last 72 hours. Thyroid Function Tests: No results for input(s): TSH, T4TOTAL, FREET4, T3FREE, THYROIDAB in the last 72 hours. Anemia Panel: No results for input(s): VITAMINB12, FOLATE, FERRITIN, TIBC, IRON, RETICCTPCT in the last 72 hours. Urine analysis:    Component Value Date/Time   COLORURINE YELLOW (A) 06/05/2020 1124   APPEARANCEUR HAZY (A) 06/05/2020 1124   LABSPEC 1.008 06/05/2020 1124   PHURINE 7.0 06/05/2020 1124   GLUCOSEU NEGATIVE 06/05/2020 1124   HGBUR NEGATIVE 06/05/2020 1124   BILIRUBINUR NEGATIVE 06/05/2020 1124   KETONESUR NEGATIVE 06/05/2020 1124   PROTEINUR NEGATIVE 06/05/2020 1124   NITRITE NEGATIVE 06/05/2020 1124   LEUKOCYTESUR NEGATIVE 06/05/2020 1124   Sepsis  Labs: @LABRCNTIP (procalcitonin:4,lacticidven:4)  ) Recent Results (from the past 240 hour(s))  SARS CORONAVIRUS 2 (TAT 6-24 HRS) Nasopharyngeal Nasopharyngeal Swab     Status: None   Collection Time: 06/02/20  2:45 PM   Specimen: Nasopharyngeal Swab  Result Value Ref Range Status   SARS Coronavirus 2 NEGATIVE NEGATIVE Final    Comment: (NOTE) SARS-CoV-2 target nucleic acids are NOT DETECTED.  The SARS-CoV-2 RNA is generally detectable in upper and lower respiratory specimens during the acute phase of infection. Negative results do not preclude SARS-CoV-2 infection, do not rule out co-infections with other pathogens, and should not be used as the sole basis for treatment or other patient management decisions. Negative results must be combined with clinical observations, patient history, and epidemiological information. The expected result is Negative.  Fact Sheet for Patients: 08/02/20  Fact Sheet for Healthcare Providers: HairSlick.no  This test is not yet approved or cleared by the [place]ir[place]ir.com FDA and  has been authorized for detection and/or diagnosis of SARS-CoV-2 by FDA under an Emergency Use Authorization (EUA). This EUA will remain  in effect (meaning this test can be used) for the duration of the COVID-19 declaration under Se ction 564(b)(1) of the Act, 21 U.S.C. section 360bbb-3(b)(1), unless the authorization is terminated or revoked sooner.  Performed at [facility], [address], [place], [place]   CULTURE, BLOOD (ROUTINE X 2) w Reflex to ID Panel     Status: None (Preliminary result)   Collection Time: 06/05/20 11:11 AM   Specimen: BLOOD  Result Value Ref Range Status   Specimen Description BLOOD LEFT HAND  Final   Special Requests   Final    BOTTLES DRAWN AEROBIC AND ANAEROBIC Blood Culture adequate volume   Culture   Final    NO GROWTH < 24 HOURS Performed at [facility], [address] [place], [place]    Report Status PENDING  Incomplete  CULTURE, BLOOD (ROUTINE X 2) w Reflex to ID Panel     Status: None (Preliminary result)   Collection Time: 06/05/20 11:11 AM   Specimen: BLOOD  Result Value Ref Range Status   Specimen Description BLOOD RIGHT HAND  Final   Special Requests  Final    BOTTLES DRAWN AEROBIC AND ANAEROBIC Blood Culture adequate volume   Culture   Final    NO GROWTH < 24 HOURS Performed at [facility], [address] [place], [place] [address]    Report Status PENDING  Incomplete  Urine Culture     Status: Abnormal   Collection Time: 06/05/20 11:24 AM   Specimen: Urine, Random  Result Value Ref Range Status   Specimen Description   Final    URINE, RANDOM Performed at [facility], [address] [place], [place] [address]    Special Requests   Final    NONE Performed at [facility], [address] [place], [place] [address]    Culture MULTIPLE SPECIES PRESENT, SUGGEST RECOLLECTION (A)  Final   Report Status 06/06/2020 FINAL  Final  MRSA PCR Screening     Status: Abnormal   Collection Time: 06/05/20 11:36 AM   Specimen: Nasopharyngeal  Result Value Ref Range Status   MRSA by PCR POSITIVE (A) NEGATIVE Final    Comment:        The GeneXpert MRSA Assay (FDA approved for NASAL specimens only), is one component of a comprehensive MRSA colonization surveillance program. It is not intended to diagnose MRSA infection nor to guide or monitor treatment for MRSA infections. RESULT CALLED TO, READ BACK BY AND VERIFIED WITH: JULISA LEA 06/05/20 AT 1325 BY ACR Performed at [facility], [address] [place], [place] [address]          Radiology Studies: US RENAL  Result Date: 06/05/2020 CLINICAL DATA:  Hyper potassium.  Acute renal failure. EXAM: RENAL / URINARY TRACT ULTRASOUND COMPLETE COMPARISON:  None. FINDINGS: Right Kidney: Renal measurements: 11.7 x  4.6 x 4.4 cm = volume: 123 mL. Contains a 1.8 cm cyst. No hydronephrosis. Left Kidney: Renal measurements: 9.5 x 5.0 x 4.5 cm = volume: 110 mL. Contains a 1.3 cm cyst. Bladder: Appears normal for degree of bladder distention. Other: Right pleural effusion.  Enlarged prostate with a volume of 93 cc. IMPRESSION: 1. A single small cyst is seen in each kidney.  No hydronephrosis. 2. Right pleural effusion. 3. Enlarged prostate with a volume of 93 cc. Electronically Signed   By: Gerome Samavid  Williams III M.D   On: 06/05/2020 14:51   DG Chest Port 1 View  Result Date: 06/05/2020 CLINICAL DATA:  Hyperthermia.  Failure to thrive. EXAM: PORTABLE CHEST 1 VIEW COMPARISON:  06/02/2020 FINDINGS: Patient rotated left. Midline trachea. Mild cardiomegaly. Atherosclerosis in the transverse aorta. No pleural effusion or pneumothorax. Pulmonary interstitial prominence and indistinctness, increased. Left base airspace disease, new or increased. IMPRESSION: Worsened aeration with mild interstitial edema which is increased. Developing left lower lobe airspace disease, most likely atelectasis. Aortic Atherosclerosis (ICD10-I70.0). Electronically Signed   By: Jeronimo GreavesKyle  Talbot M.D.   On: 06/05/2020 14:33        Scheduled Meds: . carbidopa-levodopa  1 tablet Oral TID AC  . enoxaparin (LOVENOX) injection  40 mg Subcutaneous Q24H  . ferrous sulfate  325 mg Oral Q48H  . patiromer  8.4 g Oral Daily   Continuous Infusions: . sodium chloride 75 mL/hr at 06/05/20 1552     LOS: 4 days    Time spent: 30 min    Keith BilisNoah B Junnie Loschiavo, MD [facility]   If 7PM-7AM, please contact night-coverage [email or web address] Password [email or web address] 06/06/2020, 11:57 AM

## 2020-06-06 NOTE — Progress Notes (Signed)
[facility]  ROUNDING NOTE   Subjective:   K 5.1 Not able to answer questions.   Objective:  Vital signs in last 24 hours:  Temp:  [97.2 F (36.2 C)-98.5 F (36.9 C)] 98.1 F (36.7 C) (03/07 1134) Pulse Rate:  [60-67] 61 (03/07 1134) Resp:  [14-18] 14 (03/07 1134) BP: (106-148)/(50-76) 147/71 (03/07 1134) SpO2:  [92 %-98 %] 98 % (03/07 1134) Weight:  [77.7 kg] 77.7 kg (03/07 0100)  Weight change: 3.311 kg Filed Weights   06/04/20 0114 06/05/20 0325 06/06/20 0100  Weight: 77.7 kg 74.4 kg 77.7 kg    Intake/Output: I/O last 3 completed shifts: In: 313.4 [P.O.:240; IV Piggyback:73.4] Out: 3750 [Urine:3750]   Intake/Output this shift:  Total I/O In: 471.5 [P.O.:120; IV Piggyback:351.5] Out: 300 [Urine:300]  Physical Exam: General: NAD,   Head: Normocephalic, atraumatic. Moist oral mucosal membranes  Eyes: Anicteric, PERRL  Neck: Supple, trachea midline  Lungs:  Clear to auscultation  Heart: Regular rate and rhythm  Abdomen:  Soft, nontender,   Extremities:  no peripheral edema.  Neurologic: Nonfocal, moving all four extremities  Skin: No lesions       Basic Metabolic Panel: Recent Labs  Lab 06/02/20 1917 06/02/20 2306 06/03/20 0439 06/03/20 1217 06/04/20 0442 06/04/20 1753 06/05/20 0505 06/05/20 1114 06/06/20 0425  NA 135   < > 140   < > 138 136 139 138 140  K 6.7*   < > 6.0*   < > 5.7* 6.1* 5.8* 5.9* 5.1  CL 110   < > 111   < > 114* 111 115* 115* 116*  CO2 21*   < > 24   < > 21* 22 21* 20* 21*  GLUCOSE 81   < > 89   < > 69* 94 72 103* 85  BUN 38*   < > 31*   < > 25* 25* 23 24* 27*  CREATININE 0.93   < > 0.71   < > 0.70 0.77 0.65 0.77 1.00  CALCIUM 9.0   < > 8.8*   < > 8.7* 9.2 8.9 8.6* 8.9  MG 2.2  --  2.0  --   --   --   --   --   --    < > = values in this interval not displayed.    Liver Function Tests: Recent Labs  Lab 06/02/20 1234  AST 27  ALT 15  ALKPHOS 120  BILITOT 0.5  PROT 7.0  ALBUMIN 3.7   Recent Labs  Lab  06/05/20 1114  LIPASE 38   No results for input(s): AMMONIA in the last 168 hours.  CBC: Recent Labs  Lab 06/02/20 1234 06/03/20 0439 06/05/20 0505 06/05/20 1114 06/06/20 0425  WBC 6.0 5.2 3.8* 3.7* 5.3  NEUTROABS  --   --   --  2.8 3.5  HGB 10.4* 8.9* 9.2* 8.4* 8.9*  HCT 33.2* 29.1* 28.7* 26.9* 28.8*  MCV 89.0 89.8 87.5 89.7 88.9  PLT 144* 122* 114* 120* 121*    Cardiac Enzymes: Recent Labs  Lab 06/02/20 1917 06/05/20 1114  CKTOTAL 33* 42*    BNP: Invalid input(s): POCBNP  CBG: Recent Labs  Lab 06/02/20 2107 06/03/20 0738 06/04/20 0641 06/04/20 1941  GLUCAP 154* 75 120* 97    Microbiology: Results for orders placed or performed during the hospital encounter of 06/02/20  SARS CORONAVIRUS 2 (TAT 6-24 HRS) Nasopharyngeal Nasopharyngeal Swab     Status: None   Collection Time: 06/02/20  2:45 PM  Specimen: Nasopharyngeal Swab  Result Value Ref Range Status   SARS Coronavirus 2 NEGATIVE NEGATIVE Final    Comment: (NOTE) SARS-CoV-2 target nucleic acids are NOT DETECTED.  The SARS-CoV-2 RNA is generally detectable in upper and lower respiratory specimens during the acute phase of infection. Negative results do not preclude SARS-CoV-2 infection, do not rule out co-infections with other pathogens, and should not be used as the sole basis for treatment or other patient management decisions. Negative results must be combined with clinical observations, patient history, and epidemiological information. The expected result is Negative.  Fact Sheet for Patients: HairSlick.no  Fact Sheet for Healthcare Providers: quierodirigir.com  This test is not yet approved or cleared by the [place] FDA and  has been authorized for detection and/or diagnosis of SARS-CoV-2 by FDA under an Emergency Use Authorization (EUA). This EUA will remain  in effect (meaning this test can be used) for the duration of  the COVID-19 declaration under Se ction 564(b)(1) of the Act, 21 U.S.C. section 360bbb-3(b)(1), unless the authorization is terminated or revoked sooner.  Performed at [facility], [address], [place], [place] [address]   CULTURE, BLOOD (ROUTINE X 2) w Reflex to ID Panel     Status: None (Preliminary result)   Collection Time: 06/05/20 11:11 AM   Specimen: BLOOD  Result Value Ref Range Status   Specimen Description BLOOD LEFT HAND  Final   Special Requests   Final    BOTTLES DRAWN AEROBIC AND ANAEROBIC Blood Culture adequate volume   Culture   Final    NO GROWTH < 24 HOURS Performed at [facility], [address], [place], [place] [address]    Report Status PENDING  Incomplete  CULTURE, BLOOD (ROUTINE X 2) w Reflex to ID Panel     Status: None (Preliminary result)   Collection Time: 06/05/20 11:11 AM   Specimen: BLOOD  Result Value Ref Range Status   Specimen Description BLOOD RIGHT HAND  Final   Special Requests   Final    BOTTLES DRAWN AEROBIC AND ANAEROBIC Blood Culture adequate volume   Culture   Final    NO GROWTH < 24 HOURS Performed at [facility], [address] [place], [place] [address]    Report Status PENDING  Incomplete  Urine Culture     Status: Abnormal   Collection Time: 06/05/20 11:24 AM   Specimen: Urine, Random  Result Value Ref Range Status   Specimen Description   Final    URINE, RANDOM Performed at [facility], [address], [place], [place] [address]    Special Requests   Final    NONE Performed at [facility], [address] [place], [place] [address]    Culture MULTIPLE SPECIES PRESENT, SUGGEST RECOLLECTION (A)  Final   Report Status 06/06/2020 FINAL  Final  MRSA PCR Screening     Status: Abnormal   Collection Time: 06/05/20 11:36 AM   Specimen: Nasopharyngeal  Result Value Ref Range Status   MRSA by PCR POSITIVE (A) NEGATIVE Final    Comment:        The GeneXpert MRSA Assay  (FDA approved for NASAL specimens only), is one component of a comprehensive MRSA colonization surveillance program. It is not intended to diagnose MRSA infection nor to guide or monitor treatment for MRSA infections. RESULT CALLED TO, READ BACK BY AND VERIFIED WITH: JULISA LEA 06/05/20 AT 1325 BY ACR Performed at [facility], [address] [place],  [place] [address]     Coagulation Studies: No results for input(s): LABPROT, INR in the last 72 hours.  Urinalysis: Recent Labs    06/05/20 1124  COLORURINE YELLOW*  LABSPEC 1.008  PHURINE 7.0  GLUCOSEU NEGATIVE  HGBUR NEGATIVE  BILIRUBINUR NEGATIVE  KETONESUR NEGATIVE  PROTEINUR NEGATIVE  NITRITE NEGATIVE  LEUKOCYTESUR NEGATIVE      Imaging: US RENAL  Result Date: 06/05/2020 CLINICAL DATA:  Hyper potassium.  Acute renal failure. EXAM: RENAL / URINARY TRACT ULTRASOUND COMPLETE COMPARISON:  None. FINDINGS: Right Kidney: Renal measurements: 11.7 x 4.6 x 4.4 cm = volume: 123 mL. Contains a 1.8 cm cyst. No hydronephrosis. Left Kidney: Renal measurements: 9.5 x 5.0 x 4.5 cm = volume: 110 mL. Contains a 1.3 cm cyst. Bladder: Appears normal for degree of bladder distention. Other: Right pleural effusion.  Enlarged prostate with a volume of 93 cc. IMPRESSION: 1. A single small cyst is seen in each kidney.  No hydronephrosis. 2. Right pleural effusion. 3. Enlarged prostate with a volume of 93 cc. Electronically Signed   By: Gerome Sam III M.D   On: 06/05/2020 14:51   DG Chest Port 1 View  Result Date: 06/05/2020 CLINICAL DATA:  Hyperthermia.  Failure to thrive. EXAM: PORTABLE CHEST 1 VIEW COMPARISON:  06/02/2020 FINDINGS: Patient rotated left. Midline trachea. Mild cardiomegaly. Atherosclerosis in the transverse aorta. No pleural effusion or pneumothorax. Pulmonary interstitial prominence and indistinctness, increased. Left base airspace disease, new or increased. IMPRESSION: Worsened aeration with mild interstitial edema  which is increased. Developing left lower lobe airspace disease, most likely atelectasis. Aortic Atherosclerosis (ICD10-I70.0). Electronically Signed   By: Jeronimo Greaves M.D.   On: 06/05/2020 14:33     Medications:   . sodium chloride 75 mL/hr at 06/05/20 1552   . carbidopa-levodopa  1 tablet Oral TID AC  . enoxaparin (LOVENOX) injection  40 mg Subcutaneous Q24H  . ferrous sulfate  325 mg Oral Q48H  . patiromer  8.4 g Oral Daily     Assessment/ Plan:  Mr. Keith Hicks is a 87 y.o. white male with Parkinson's, dementia, hypertension, hyperlipidemia, who was admitted to [facility] on 06/02/2020 for Hyperkalemia [E87.5]  1. Acute kidney injury with persistent hyperkalemia:  Differential does include adrenal insufficiency, renal tubular acidosis type IV, iatrogenic or another cause. Acute kidney injury improved with holding benazepril and IV fluids Unclear if hyperkalemia is residual from acute kidney injury. - discontinued benazepril - Continue potassium binders - Continue IV fluids  2. Hypertension: Holding benazepril. Blood pressure elevated at 147/71.    LOS: 4 Sarath Kolluru 3/7/202211:43 AM

## 2020-06-07 DIAGNOSIS — E875 Hyperkalemia: Secondary | ICD-10-CM | POA: Diagnosis not present

## 2020-06-07 LAB — BASIC METABOLIC PANEL
Anion gap: 5 (ref 5–15)
BUN: 21 mg/dL (ref 8–23)
CO2: 20 mmol/L — ABNORMAL LOW (ref 22–32)
Calcium: 9.1 mg/dL (ref 8.9–10.3)
Chloride: 114 mmol/L — ABNORMAL HIGH (ref 98–111)
Creatinine, Ser: 0.86 mg/dL (ref 0.61–1.24)
GFR, Estimated: >60 mL/min (ref >60)
Glucose, Bld: 114 mg/dL — ABNORMAL HIGH (ref 70–99)
Potassium: 4.4 mmol/L (ref 3.5–5.1)
Sodium: 139 mmol/L (ref 135–145)

## 2020-06-07 LAB — CBC WITH DIFFERENTIAL/PLATELET
Abs Immature Granulocytes: 0.02 10*3/uL (ref 0.00–0.07)
Basophils Absolute: 0 10*3/uL (ref 0.0–0.1)
Basophils Relative: 0 %
Eosinophils Absolute: 0.1 10*3/uL (ref 0.0–0.5)
Eosinophils Relative: 2 %
HCT: 29.9 % — ABNORMAL LOW (ref 39.0–52.0)
Hemoglobin: 9.7 g/dL — ABNORMAL LOW (ref 13.0–17.0)
Immature Granulocytes: 0 %
Lymphocytes Relative: 10 %
Lymphs Abs: 0.6 10*3/uL — ABNORMAL LOW (ref 0.7–4.0)
MCH: 28.1 pg (ref 26.0–34.0)
MCHC: 32.4 g/dL (ref 30.0–36.0)
MCV: 86.7 fL (ref 80.0–100.0)
Monocytes Absolute: 0.6 10*3/uL (ref 0.1–1.0)
Monocytes Relative: 9 %
Neutro Abs: 4.9 10*3/uL (ref 1.7–7.7)
Neutrophils Relative %: 79 %
Platelets: 119 10*3/uL — ABNORMAL LOW (ref 150–400)
RBC: 3.45 MIL/uL — ABNORMAL LOW (ref 4.22–5.81)
RDW: 16.8 % — ABNORMAL HIGH (ref 11.5–15.5)
WBC: 6.2 10*3/uL (ref 4.0–10.5)
nRBC: 0 % (ref 0.0–0.2)

## 2020-06-07 MED ORDER — MUPIROCIN 2 % EX OINT
1.0000 "application " | TOPICAL_OINTMENT | Freq: Two times a day (BID) | CUTANEOUS | Status: AC
  Administered 2020-06-07 – 2020-06-11 (×10): 1 via NASAL
  Filled 2020-06-07: qty 22

## 2020-06-07 MED ORDER — CHLORHEXIDINE GLUCONATE CLOTH 2 % EX PADS
6.0000 | MEDICATED_PAD | Freq: Every day | CUTANEOUS | Status: AC
  Administered 2020-06-07 – 2020-06-11 (×5): 6 via TOPICAL

## 2020-06-07 NOTE — Progress Notes (Signed)
Speech Language Pathology Treatment: Dysphagia  Patient Details Name: Keith Hicks MRN: [id number] DOB: 1924-06-16 Today's Date: 06/07/2020 Time: 4098-1191 SLP Time Calculation (min) (ACUTE ONLY): 15 min  Assessment / Plan / Recommendation Clinical Impression  Patient seen for follow-up for dysphagia; attempted trials of puree, nectar and upgraded trials of thin. SLP performed oral care and adjusted pt for upright position prior to attempting PO trials. Patient remains confused, but allows oral care. He is following commands inconsistently but is responding more appropriately than during previous session. Attempted to assist pt with bolus retrieval (thin, nectar, puree) by teaspoon, cup and straw. Patient pressed lips to straw but blew bubbles instead of sipping, even with max cues, then removed straw from cup in confusion. With further attempts by cup, pt pushed cup away. Per RN, patient ate most of breakfast with full assist; she gave liquid by teaspoon, reports pt had some throat clearing after liquid, tolerated puree well. Able to locate in Care Everywhere MBS report from the [place] in June of 2021, which showed delay in swallow initiation, reduced base of tongue retraction and pharyngeal constriction, severe post-swallow residue with puree and moderate with liquids, and aspiration of thin, nectar (silent), and honey thick (silent) liquids. SLP able to reach pt's son Jillyn Hidden by phone, who reported prior to admission patient was working with speech therapist, on puree/thin liquids. SLP had a long discussion with pt's son who verbalized understanding that swallowing deficits are part of Parkinson's disease, and notes that with this admission he has seen a decline in his father's mental status. Educated on risks of aspiration, aspiration pneumonia, and that based on his history/presentation pt is likely chronically aspirating. Patient has not had aspiration pneumonia or pulmonary complications, per son. SLP  educated that with hospitalization, acute illness, and diminished functional reserve, pt at increased risk for aspiration in context of chronic dysphagia and altered mental status, regardless of diet consistency. Son stated he wants his father to eat and drink what is most comfortable. We opted to continue puree/nectar at this time, with likely advancement to thin liquids if pt tolerates this clinically next SLP visit, with son verbalizing understanding of aspiration risks. Given likely chronic silent aspiration, palliative care consult may be beneficial. Continue puree/nectar with full assist/supervision for feeding, meds crushed. Perform oral care prior to feeding and position fully upright. Cue pt to swallow multiple times and clear throat occasionally.      HPI HPI: Pt is a 87 y.o. male with medical history significant for dementia, Parkinson's disease, hypertension, who is admitted to [facility] on 06/02/2020 with hyperkalemia after presenting from [facility] SNF to [facility] ED for evaluation of altered mental status. In the setting of baseline dementia, the staff at [facility] feel that the patient has been more confused relative to this baseline over the course of the last 2 days.  Imaging: Mild bilateral interstitial prominence, which could reflect mild  edema. Appearance has improved compared to the previous study on 04/19/2020.      SLP Plan  Continue with current plan of care       Recommendations  Diet recommendations: Dysphagia 1 (puree);Nectar-thick liquid Liquids provided via: Cup;No straw Medication Administration: Crushed with puree Supervision: Full supervision/cueing for compensatory strategies Compensations: Minimize environmental distractions;Slow rate;Small sips/bites Postural Changes and/or Swallow Maneuvers: Seated upright 90 degrees                Plan: Continue with current plan of care       GO  Rondel Baton, [place],  CCC-SLP Speech-Language Pathologist  Arlana Lindau 06/07/2020, 3:10 PM

## 2020-06-07 NOTE — Progress Notes (Signed)
Patient was able to eat a magic up, cup of applesauce and pureed pineapple sent on breakfast tray. Bathed and resting in bed at this time.

## 2020-06-07 NOTE — Progress Notes (Addendum)
PROGRESS NOTE    Keith Hicks  NTZ:[id number] DOB: [birth year] DOA: 06/02/2020 PCP: Almetta Lovely, [facility]  Outpatient Specialists: none    Brief Narrative:   From admission hpi Keith Hicks is a 87 y.o. male with medical history significant for dementia, Parkinson's disease, hypertension, who is admitted to [facility] on 06/02/2020 with hyperkalemia after presenting from [facility] SNF to [facility] ED for evaluation of altered mental status.   In the context of the patient's altered mental status, the following history was obtained via my discussions with the emergency department physician as well as via chart review.  In the setting of baseline dementia, the staff at [facility] feel that the patient has been more confused relative to this baseline over the course of the last 2 days.  They reportedly have not detected significant somnolence over that time, but rather believe that patient is exhibiting worsening confusion over that timeframe.  This is been associated with decline in oral intake over the last 2 to 3 days, in the absence of any reported vomiting or diarrhea.  No evidence of objective fever at SNF.  No reported recent trauma.  No evidence of respiratory distress or cough.  Patient's medical history is notable for history of essential hypertension, for which he is on benazepril as a sole outpatient antihypertensive agent.  In the setting of Parkinson's disease, he is also on levodopa/carbidopa.  Does not appear to be on any testing as an outpatient.   Assessment & Plan:   Principal Problem:   Hyperkalemia Active Problems:   AKI (acute kidney injury) (HCC)   Dehydration   Acute metabolic encephalopathy   Hypertension   Parkinson's disease (HCC)  # Hyperkalemia Acute, was upper limit of normal last month. Suspect 2/2 dehydration (report of decreased po) and acei use. 6.6 on presentation. Received calcium, bicarb, and several rounds of insulin w/ dextrose.  Nephrology consulted 3/6 for persistent hyperkalemia. K now wnl. Think most likely 2/2 acei - holding veltassa - will d/c fluids - renin, aldosterone pending - d/c telemetry - will f/u additional nephrology recs when available - holding home acei, topical nsaid - likely d/c tomorrow assuming k remains wnl. Pt/ot consulted. May need to go to snf (currently in assisted living)  # Hypothermia # Hypotension # Leukopenia New the morning of 3/6. Resolved. Infectious w/u negative. Given empiric vanc/zosyn which will d/c.  - monitor  # Aspiration risk SLP following, says review of previous SLP notes shows patient has a hx of silent aspiration - monitor - aspiration precautions  # AKI Resolved with fluids  # Acute metabolic encephalopathy Baseline parkinsons dementia. ua and cxr not suggestive of infection. CT head w/o acute findings. Hyperkalemia likely contributory, no other electrolyte abnormalities. covid neg. Mental status improved - monitor  # Essential hypertension Here bp wnl - holding benazepril as above  # Parkinson's - re-started sinimet   # Subclinical hypothyroidism - defer starting tx given age  # Normocytic anemia # Iron deficiency anemia Iron deficient, unremarkable smear and b12/folate. No report of hematochezia or melena. Hgb stable - started oral iron - monitor hgb  # Thrombocytopenia Mild, stable. Hiv, hcv, smear unremarkable - monitor  # Pressure ulcer right heal Bandaged, wound nurse following   DVT prophylaxis: lovenox Code Status: DNR Family Communication: son Jillyn Hidden updated telephonically 3/8  Level of care: Progressive Cardiac Status is: Inpatient  Remains inpatient appropriate because:Inpatient level of care appropriate due to severity of illness   Dispo: The patient is  from: ALF              Anticipated d/c is to: ALF vs snf              Patient currently is not medically stable to d/c.   Difficult to place patient  No        Consultants:  none  Procedures: none  Antimicrobials:  none    Subjective: This morning awake, responds to questions, no complaints. Ate breakfast.  Objective: Vitals:   06/06/20 2005 06/07/20 0324 06/07/20 0325 06/07/20 0732  BP: (!) 160/84  (!) 148/67 (!) 151/73  Pulse: 63   64  Resp: 16  14 14   Temp: 97.6 F (36.4 C)  98.1 F (36.7 C) 98.2 F (36.8 C)  TempSrc: Axillary  Axillary Axillary  SpO2: 95%  97% 97%  Weight:  76 kg    Height:        Intake/Output Summary (Last 24 hours) at 06/07/2020 1117 Last data filed at 06/07/2020 0930 Gross per 24 hour  Intake 375 ml  Output 1375 ml  Net -1000 ml   Filed Weights   06/05/20 0325 06/06/20 0100 06/07/20 0324  Weight: 74.4 kg 77.7 kg 76 kg    Examination:  General exam: Appears calm and comfortable  Respiratory system: Clear to auscultation. Respiratory effort normal. Cardiovascular system: S1 & S2 heard, RRR. No JVD, murmurs, rubs, gallops or clicks. 1+ pedal edema Gastrointestinal system: Abdomen is nondistended, soft and nontender. No organomegaly or masses felt. Normal bowel sounds heard. Central nervous system: awake, moving all 4 extremities Extremities: warm Skin: hyperkeratotic plaques face. Bandaged ulcer right heel Psychiatry: not agitated     Data Reviewed: I have personally reviewed following labs and imaging studies  CBC: Recent Labs  Lab 06/03/20 0439 06/05/20 0505 06/05/20 1114 06/06/20 0425 06/07/20 0420  WBC 5.2 3.8* 3.7* 5.3 6.2  NEUTROABS  --   --  2.8 3.5 4.9  HGB 8.9* 9.2* 8.4* 8.9* 9.7*  HCT 29.1* 28.7* 26.9* 28.8* 29.9*  MCV 89.8 87.5 89.7 88.9 86.7  PLT 122* 114* 120* 121* 119*   Basic Metabolic Panel: Recent Labs  Lab 06/02/20 1917 06/02/20 2306 06/03/20 0439 06/03/20 1217 06/04/20 1753 06/05/20 0505 06/05/20 1114 06/06/20 0425 06/07/20 0420  NA 135   < > 140   < > 136 139 138 140 139  K 6.7*   < > 6.0*   < > 6.1* 5.8* 5.9* 5.1 4.4  CL 110   < >  111   < > 111 115* 115* 116* 114*  CO2 21*   < > 24   < > 22 21* 20* 21* 20*  GLUCOSE 81   < > 89   < > 94 72 103* 85 114*  BUN 38*   < > 31*   < > 25* 23 24* 27* 21  CREATININE 0.93   < > 0.71   < > 0.77 0.65 0.77 1.00 0.86  CALCIUM 9.0   < > 8.8*   < > 9.2 8.9 8.6* 8.9 9.1  MG 2.2  --  2.0  --   --   --   --   --   --    < > = values in this interval not displayed.   GFR: Estimated Creatinine Clearance: 55.4 mL/min (by C-G formula based on SCr of 0.86 mg/dL). Liver Function Tests: Recent Labs  Lab 06/02/20 1234  AST 27  ALT 15  ALKPHOS 120  BILITOT 0.5  PROT 7.0  ALBUMIN 3.7   Recent Labs  Lab 06/05/20 1114  LIPASE 38   No results for input(s): AMMONIA in the last 168 hours. Coagulation Profile: No results for input(s): INR, PROTIME in the last 168 hours. Cardiac Enzymes: Recent Labs  Lab 06/02/20 1917 06/05/20 1114  CKTOTAL 33* 42*   BNP (last 3 results) No results for input(s): PROBNP in the last 8760 hours. HbA1C: No results for input(s): HGBA1C in the last 72 hours. CBG: Recent Labs  Lab 06/02/20 2107 06/03/20 0738 06/04/20 0641 06/04/20 1941  GLUCAP 154* 75 120* 97   Lipid Profile: No results for input(s): CHOL, HDL, LDLCALC, TRIG, CHOLHDL, LDLDIRECT in the last 72 hours. Thyroid Function Tests: No results for input(s): TSH, T4TOTAL, FREET4, T3FREE, THYROIDAB in the last 72 hours. Anemia Panel: No results for input(s): VITAMINB12, FOLATE, FERRITIN, TIBC, IRON, RETICCTPCT in the last 72 hours. Urine analysis:    Component Value Date/Time   COLORURINE YELLOW (A) 06/05/2020 1124   APPEARANCEUR HAZY (A) 06/05/2020 1124   LABSPEC 1.008 06/05/2020 1124   PHURINE 7.0 06/05/2020 1124   GLUCOSEU NEGATIVE 06/05/2020 1124   HGBUR NEGATIVE 06/05/2020 1124   BILIRUBINUR NEGATIVE 06/05/2020 1124   KETONESUR NEGATIVE 06/05/2020 1124   PROTEINUR NEGATIVE 06/05/2020 1124   NITRITE NEGATIVE 06/05/2020 1124   LEUKOCYTESUR NEGATIVE 06/05/2020 1124   Sepsis  Labs: @LABRCNTIP (procalcitonin:4,lacticidven:4)  ) Recent Results (from the past 240 hour(s))  SARS CORONAVIRUS 2 (TAT 6-24 HRS) Nasopharyngeal Nasopharyngeal Swab     Status: None   Collection Time: 06/02/20  2:45 PM   Specimen: Nasopharyngeal Swab  Result Value Ref Range Status   SARS Coronavirus 2 NEGATIVE NEGATIVE Final    Comment: (NOTE) SARS-CoV-2 target nucleic acids are NOT DETECTED.  The SARS-CoV-2 RNA is generally detectable in upper and lower respiratory specimens during the acute phase of infection. Negative results do not preclude SARS-CoV-2 infection, do not rule out co-infections with other pathogens, and should not be used as the sole basis for treatment or other patient management decisions. Negative results must be combined with clinical observations, patient history, and epidemiological information. The expected result is Negative.  Fact Sheet for Patients: 08/02/20  Fact Sheet for Healthcare Providers: HairSlick.no  This test is not yet approved or cleared by the quierodirigir.com FDA and  has been authorized for detection and/or diagnosis of SARS-CoV-2 by FDA under an Emergency Use Authorization (EUA). This EUA will remain  in effect (meaning this test can be used) for the duration of the COVID-19 declaration under Se ction 564(b)(1) of the Act, 21 U.S.C. section 360bbb-3(b)(1), unless the authorization is terminated or revoked sooner.  Performed at [facility], [address], [place], [place]   CULTURE, BLOOD (ROUTINE X 2) w Reflex to ID Panel     Status: None (Preliminary result)   Collection Time: 06/05/20 11:11 AM   Specimen: BLOOD  Result Value Ref Range Status   Specimen Description BLOOD LEFT HAND  Final   Special Requests   Final    BOTTLES DRAWN AEROBIC AND ANAEROBIC Blood Culture adequate volume   Culture   Final    NO GROWTH 2 DAYS Performed at [facility], [address] [place], [place]    Report Status PENDING  Incomplete  CULTURE, BLOOD (ROUTINE X 2) w Reflex to ID Panel     Status: None (Preliminary result)   Collection Time: 06/05/20 11:11 AM   Specimen: BLOOD  Result Value Ref  Range Status   Specimen Description BLOOD RIGHT HAND  Final   Special Requests   Final    BOTTLES DRAWN AEROBIC AND ANAEROBIC Blood Culture adequate volume   Culture   Final    NO GROWTH 2 DAYS Performed at [facility], [address] [place], [place] [address]    Report Status PENDING  Incomplete  Urine Culture     Status: Abnormal   Collection Time: 06/05/20 11:24 AM   Specimen: Urine, Random  Result Value Ref Range Status   Specimen Description   Final    URINE, RANDOM Performed at [facility], [address], [place], [place] [address]    Special Requests   Final    NONE Performed at [facility], [address] [place], [place] [address]    Culture MULTIPLE SPECIES PRESENT, SUGGEST RECOLLECTION (A)  Final   Report Status 06/06/2020 FINAL  Final  MRSA PCR Screening     Status: Abnormal   Collection Time: 06/05/20 11:36 AM   Specimen: Nasopharyngeal  Result Value Ref Range Status   MRSA by PCR POSITIVE (A) NEGATIVE Final    Comment:        The GeneXpert MRSA Assay (FDA approved for NASAL specimens only), is one component of a comprehensive MRSA colonization surveillance program. It is not intended to diagnose MRSA infection nor to guide or monitor treatment for MRSA infections. RESULT CALLED TO, READ BACK BY AND VERIFIED WITH: JULISA LEA 06/05/20 AT 1325 BY ACR Performed at [facility], [address] [place], [place] [address]          Radiology Studies: US RENAL  Result Date: 06/05/2020 CLINICAL DATA:  Hyper potassium.  Acute renal failure. EXAM: RENAL / URINARY TRACT ULTRASOUND COMPLETE COMPARISON:  None. FINDINGS: Right Kidney: Renal measurements: 11.7 x 4.6 x 4.4 cm  = volume: 123 mL. Contains a 1.8 cm cyst. No hydronephrosis. Left Kidney: Renal measurements: 9.5 x 5.0 x 4.5 cm = volume: 110 mL. Contains a 1.3 cm cyst. Bladder: Appears normal for degree of bladder distention. Other: Right pleural effusion.  Enlarged prostate with a volume of 93 cc. IMPRESSION: 1. A single small cyst is seen in each kidney.  No hydronephrosis. 2. Right pleural effusion. 3. Enlarged prostate with a volume of 93 cc. Electronically Signed   By: Gerome Sam III M.D   On: 06/05/2020 14:51   DG Chest Port 1 View  Result Date: 06/05/2020 CLINICAL DATA:  Hyperthermia.  Failure to thrive. EXAM: PORTABLE CHEST 1 VIEW COMPARISON:  06/02/2020 FINDINGS: Patient rotated left. Midline trachea. Mild cardiomegaly. Atherosclerosis in the transverse aorta. No pleural effusion or pneumothorax. Pulmonary interstitial prominence and indistinctness, increased. Left base airspace disease, new or increased. IMPRESSION: Worsened aeration with mild interstitial edema which is increased. Developing left lower lobe airspace disease, most likely atelectasis. Aortic Atherosclerosis (ICD10-I70.0). Electronically Signed   By: Jeronimo Greaves M.D.   On: 06/05/2020 14:33        Scheduled Meds: . carbidopa-levodopa  1 tablet Oral TID AC  . Chlorhexidine Gluconate Cloth  6 each Topical Q0600  . enoxaparin (LOVENOX) injection  40 mg Subcutaneous Q24H  . ferrous sulfate  325 mg Oral Q48H  . mupirocin ointment  1 application Nasal BID   Continuous Infusions:    LOS: 5 days    Time spent: 30 min    Silvano Bilis, MD [facility]   If 7PM-7AM, please contact night-coverage [email or web address] Password [facility] 06/07/2020, 11:17 AM

## 2020-06-07 NOTE — Evaluation (Signed)
Occupational Therapy Evaluation Patient Details Name: Keith Hicks MRN: [id number] DOB: 25-Jul-1924 Today's Date: 06/07/2020    History of Present Illness Pt is a 87 y.o. male with medical history significant for dementia, Parkinson's disease, hypertension, who is admitted with hyperkalemia after presenting from ALF to [facility] ED for evaluation of altered mental status.  MD assessment includes: Hyperkalemia, hypothermia, hypotension, leukopenia, AKI, anemia, pressure ulcer to L lateral malleolus and acute metabolic encephalopathy.   Clinical Impression   Mr Born was seen for OT evaluation this date. Prior to hospital admission, pt was resident at ALF requiring RW for limited distances and w/c for majority of distances. Pt presents to acute OT demonstrating impaired ADL performance and functional mobility 2/2 decreased activity tolerance, poor command following, and functional strength/ROM/balance deficits. Pt currently requires MAX A hand over hand for self-feeding/drinking at bed level. Initial MAX A to demo oral hygiene with swab, pt completes c SETUP assist and MIN A for thoroughness. TOTAL A for LBD at bed level. Pt would benefit from skilled OT to address noted impairments and functional limitations (see below for any additional details) in order to maximize safety and independence while minimizing falls risk and caregiver burden. Upon hospital discharge, recommend STR to maximize pt safety and return to PLOF.     Follow Up Recommendations  SNF    Equipment Recommendations  Other (comment) (TBD)    Recommendations for Other Services       Precautions / Restrictions Precautions Precautions: Fall Restrictions Weight Bearing Restrictions: No Other Position/Activity Restrictions: Prevalon boots ordered      Mobility Bed Mobility Overal bed mobility: Needs Assistance    General bed mobility comments: MAX A adjust torso in bed    Transfers                 General transfer  comment: NT - per PT pt unable to maintain static sitting balance    Balance Overall balance assessment: Needs assistance Sitting-balance support: Bilateral upper extremity supported;Feet supported Sitting balance-Leahy Scale: Poor Sitting balance - Comments: constant min to mod A for stability       Standing balance comment: Unable/unsafe to attempt to stand                           ADL either performed or assessed with clinical judgement   ADL Overall ADL's : Needs assistance/impaired                                       General ADL Comments: MAX A hand over hand for self-feeding/drinking at bed level. Initial MAX A to demo oral hygiene with swab, pt completes c SETUP assist and MIN A for thoroughness. TOTAL A for LBD at bed level                  Pertinent Vitals/Pain Pain Assessment: Faces Faces Pain Scale: Hurts little more Pain Location: LLE Pain Descriptors / Indicators: Grimacing;Dull Pain Intervention(s): Limited activity within patient's tolerance;Repositioned     Hand Dominance Right   Extremity/Trunk Assessment Upper Extremity Assessment Upper Extremity Assessment: Generalized weakness;Difficult to assess due to impaired cognition   Lower Extremity Assessment Lower Extremity Assessment: Generalized weakness;Difficult to assess due to impaired cognition       Communication Communication Communication: HOH   Cognition Arousal/Alertness: Lethargic Behavior During Therapy: Flat affect Overall Cognitive Status: Impaired/Different  from baseline                                 General Comments: Per son, pt with h/o cognitive deficits but more pronounced recently after some "medication changes"; pt alert to self, month, and year but confused regarding location and situation and remained so even after information provided   General Comments       Exercises Exercises: Other exercises Other Exercises Other  Exercises: Pt and family educated re: OT role, DME recs, d/c recs, falls prevention, ECS Other Exercises: LBD, oral care, self-drinking/feeding   Shoulder Instructions      Home Living Family/patient expects to be discharged to:: Assisted living                             Home Equipment: Walker - 2 wheels          Prior Functioning/Environment Level of Independence: Needs assistance  Gait / Transfers Assistance Needed: Per son, pt ambulatory limited facility distances such as bed to bathroom with a RW up until "several weeks" ago, primarily uses w/c since then ADL's / Homemaking Assistance Needed: Assist for ADLs            OT Problem List: Decreased range of motion;Decreased strength;Decreased activity tolerance;Impaired balance (sitting and/or standing);Decreased safety awareness      OT Treatment/Interventions: Self-care/ADL training;Therapeutic exercise;Energy conservation;DME and/or AE instruction;Therapeutic activities;Patient/family education;Balance training    OT Goals(Current goals can be found in the care plan section) Acute Rehab OT Goals Patient Stated Goal: To eat food OT Goal Formulation: With patient/family Time For Goal Achievement: 06/21/20 Potential to Achieve Goals: Fair ADL Goals Pt Will Perform Eating: with set-up;with supervision;bed level Pt Will Perform Upper Body Bathing: with mod assist;sitting Pt Will Transfer to Toilet: with mod assist;with +2 assist (rolling at bed level)  OT Frequency: Min 1X/week    AM-PAC OT "6 Clicks" Daily Activity     Outcome Measure Help from another person eating meals?: A Lot Help from another person taking care of personal grooming?: A Lot Help from another person toileting, which includes using toliet, bedpan, or urinal?: A Lot Help from another person bathing (including washing, rinsing, drying)?: A Lot Help from another person to put on and taking off regular upper body clothing?: A Lot Help from  another person to put on and taking off regular lower body clothing?: Total 6 Click Score: 11   End of Session Nurse Communication: Mobility status  Activity Tolerance: Patient limited by fatigue Patient left: in bed;with call bell/phone within reach;with family/visitor present  OT Visit Diagnosis: Other abnormalities of gait and mobility (R26.89)                Time: 1535-1601 OT Time Calculation (min): 26 min Charges:  OT General Charges $OT Visit: 1 Visit OT Evaluation $OT Eval Low Complexity: 1 Low OT Treatments $Self Care/Home Management : 23-37 mins  Kathie Dike, M.S. OTR/L  06/07/20, 4:18 PM  ascom [phone]

## 2020-06-07 NOTE — Progress Notes (Signed)
Patient refused dinner and medication. More confused this evening an unable to reorient. Calling out for Sam.

## 2020-06-07 NOTE — Evaluation (Signed)
Physical Therapy Evaluation Patient Details Name: Keith Hicks MRN: [id number] DOB: March 11, 1925 Today's Date: 06/07/2020   History of Present Illness  Pt is a 87 y.o. male with medical history significant for dementia, Parkinson's disease, hypertension, who is admitted with hyperkalemia after presenting from ALF to [facility] ED for evaluation of altered mental status.  MD assessment includes: Hyperkalemia, hypothermia, hypotension, leukopenia, AKI, anemia, pressure ulcer to L lateral malleolus and acute metabolic encephalopathy.    Clinical Impression  Pt lethargic with flat affect but able to occasionally respond appropriately to questions and commands.  Pt required extensive physical assistance with bed mobility tasks and could not maintain static sitting balance at the EOB without constant min to mod A.  Transfers/gait deferred for pt safety secondary to poor sitting balance.  Pt will benefit from a trial of PT services in a SNF setting upon discharge to safely address deficits listed in patient problem list for decreased caregiver assistance and eventual return to PLOF.     Follow Up Recommendations SNF;Supervision/Assistance - 24 hour    Equipment Recommendations  Other (comment) (TBD at next venue of care)    Recommendations for Other Services       Precautions / Restrictions Precautions Precautions: Fall Restrictions Weight Bearing Restrictions: No Other Position/Activity Restrictions: Prevalon boots ordered      Mobility  Bed Mobility Overal bed mobility: Needs Assistance Bed Mobility: Supine to Sit;Sit to Supine     Supine to sit: +2 for physical assistance;Max assist Sit to supine: +2 for physical assistance;Max assist   General bed mobility comments: Max verbal and tactile cues for increased patient participation    Transfers                 General transfer comment: NT secondary to pt unable to maintain static sitting balance without mod A  Ambulation/Gait              General Gait Details: Unable/unsafe to attempt  Stairs            Wheelchair Mobility    Modified Rankin (Stroke Patients Only)       Balance Overall balance assessment: Needs assistance Sitting-balance support: Bilateral upper extremity supported;Feet supported Sitting balance-Leahy Scale: Poor Sitting balance - Comments: constant min to mod A for stability       Standing balance comment: Unable/unsafe to attempt to stand                             Pertinent Vitals/Pain Pain Assessment: No/denies pain Faces Pain Scale: No hurt    Home Living Family/patient expects to be discharged to:: Assisted living               Home Equipment: Walker - 2 wheels      Prior Function Level of Independence: Needs assistance   Gait / Transfers Assistance Needed: Per son, pt ambulatory limited facility distances such as bed to bathroom with a RW up until "several weeks" ago           Hand Dominance        Extremity/Trunk Assessment   Upper Extremity Assessment Upper Extremity Assessment: Generalized weakness;Difficult to assess due to impaired cognition    Lower Extremity Assessment Lower Extremity Assessment: Generalized weakness;Difficult to assess due to impaired cognition       Communication   Communication: HOH  Cognition Arousal/Alertness: Lethargic Behavior During Therapy: Flat affect Overall Cognitive Status: Impaired/Different from baseline  General Comments: Per son, pt with h/o cognitive deficits but more pronounced recently after some "medication changes"; pt alert to self, month, and year but confused regarding location and situation and remained so even after information provided      General Comments      Exercises Total Joint Exercises Short Arc Quad: AROM;AAROM;Both;10 reps Heel Slides: AAROM;Both;5 reps Hip ABduction/ADduction: AAROM;Both;5 reps Straight Leg  Raises: AAROM;Both;5 reps Other Exercises Other Exercises: static sitting at EOB with max verbal and tactile cues for anterior weight shifting   Assessment/Plan    PT Assessment Patient needs continued PT services  PT Problem List Decreased strength;Decreased activity tolerance;Decreased balance;Decreased mobility;Decreased knowledge of use of DME       PT Treatment Interventions DME instruction;Gait training;Functional mobility training;Therapeutic activities;Therapeutic exercise;Balance training;Patient/family education    PT Goals (Current goals can be found in the Care Plan section)  Acute Rehab PT Goals PT Goal Formulation: Patient unable to participate in goal setting Time For Goal Achievement: 06/20/20 Potential to Achieve Goals: Fair    Frequency Min 2X/week   Barriers to discharge Decreased caregiver support;Inaccessible home environment      Co-evaluation               AM-PAC PT "6 Clicks" Mobility  Outcome Measure Help needed turning from your back to your side while in a flat bed without using bedrails?: Total Help needed moving from lying on your back to sitting on the side of a flat bed without using bedrails?: Total Help needed moving to and from a bed to a chair (including a wheelchair)?: Total Help needed standing up from a chair using your arms (e.g., wheelchair or bedside chair)?: Total Help needed to walk in hospital room?: Total Help needed climbing 3-5 steps with a railing? : Total 6 Click Score: 6    End of Session Equipment Utilized During Treatment: Gait belt Activity Tolerance: Patient tolerated treatment well Patient left: in bed;with call bell/phone within reach;with bed alarm set;with family/visitor present Nurse Communication: Mobility status;Other (comment) (Pt order in chart for prevalon boots; nursing educated on using bolster to prevent LLE ER onto lateral malleolus) PT Visit Diagnosis: Difficulty in walking, not elsewhere classified  (R26.2);Muscle weakness (generalized) (M62.81)    Time: 2536-6440 PT Time Calculation (min) (ACUTE ONLY): 38 min   Charges:   PT Evaluation $PT Eval Moderate Complexity: 1 Mod PT Treatments $Therapeutic Exercise: 8-22 mins        D. Scott Reakwon Barren PT, DPT 06/07/20, 3:50 PM

## 2020-06-08 DIAGNOSIS — E875 Hyperkalemia: Secondary | ICD-10-CM | POA: Diagnosis not present

## 2020-06-08 LAB — BASIC METABOLIC PANEL
Anion gap: 6 (ref 5–15)
BUN: 17 mg/dL (ref 8–23)
CO2: 21 mmol/L — ABNORMAL LOW (ref 22–32)
Calcium: 9.2 mg/dL (ref 8.9–10.3)
Chloride: 113 mmol/L — ABNORMAL HIGH (ref 98–111)
Creatinine, Ser: 0.68 mg/dL (ref 0.61–1.24)
GFR, Estimated: >60 mL/min (ref >60)
Glucose, Bld: 88 mg/dL (ref 70–99)
Potassium: 3.9 mmol/L (ref 3.5–5.1)
Sodium: 140 mmol/L (ref 135–145)

## 2020-06-08 LAB — CBC WITH DIFFERENTIAL/PLATELET
Abs Immature Granulocytes: 0.01 10*3/uL (ref 0.00–0.07)
Basophils Absolute: 0 10*3/uL (ref 0.0–0.1)
Basophils Relative: 0 %
Eosinophils Absolute: 0.2 10*3/uL (ref 0.0–0.5)
Eosinophils Relative: 3 %
HCT: 29.1 % — ABNORMAL LOW (ref 39.0–52.0)
Hemoglobin: 9.6 g/dL — ABNORMAL LOW (ref 13.0–17.0)
Immature Granulocytes: 0 %
Lymphocytes Relative: 10 %
Lymphs Abs: 0.6 10*3/uL — ABNORMAL LOW (ref 0.7–4.0)
MCH: 28.2 pg (ref 26.0–34.0)
MCHC: 33 g/dL (ref 30.0–36.0)
MCV: 85.3 fL (ref 80.0–100.0)
Monocytes Absolute: 0.7 10*3/uL (ref 0.1–1.0)
Monocytes Relative: 11 %
Neutro Abs: 4.7 10*3/uL (ref 1.7–7.7)
Neutrophils Relative %: 76 %
Platelets: 129 10*3/uL — ABNORMAL LOW (ref 150–400)
RBC: 3.41 MIL/uL — ABNORMAL LOW (ref 4.22–5.81)
RDW: 16.6 % — ABNORMAL HIGH (ref 11.5–15.5)
WBC: 6.1 10*3/uL (ref 4.0–10.5)
nRBC: 0 % (ref 0.0–0.2)

## 2020-06-08 LAB — PHOSPHORUS: Phosphorus: 2.7 mg/dL (ref 2.5–4.6)

## 2020-06-08 LAB — MAGNESIUM: Magnesium: 1.7 mg/dL (ref 1.7–2.4)

## 2020-06-08 MED ORDER — AMLODIPINE BESYLATE 5 MG PO TABS
5.0000 mg | ORAL_TABLET | Freq: Every day | ORAL | Status: DC
  Administered 2020-06-08 – 2020-06-12 (×4): 5 mg via ORAL
  Filled 2020-06-08 (×6): qty 1

## 2020-06-08 MED ORDER — BENAZEPRIL HCL 5 MG PO TABS
15.0000 mg | ORAL_TABLET | Freq: Every morning | ORAL | Status: DC
  Administered 2020-06-08: 15 mg via ORAL
  Filled 2020-06-08: qty 1

## 2020-06-08 MED ORDER — AMLODIPINE BESYLATE 5 MG PO TABS
5.0000 mg | ORAL_TABLET | Freq: Once | ORAL | Status: AC
  Administered 2020-06-08: 5 mg via ORAL
  Filled 2020-06-08: qty 1

## 2020-06-08 NOTE — Progress Notes (Signed)
[facility] Progress Note  Patient: Keith Hicks    HCW:[id number]  DOA: 06/02/2020     Date of Service: the patient was seen and examined on 06/08/2020  Chief Complaint  Patient presents with  . Failure To Thrive   Brief hospital course: From admission hpi Keith Hicks a 87 y.o.malewith medical history significant fordementia, Parkinson's disease, hypertension,who is admitted to [facility] on 3/3/2022with hyperkalemiaafter [facility] ED for evaluation of altered mental status.  In the context of the patient's altered mental status, the following historywas obtained via my discussions withthe emergency department physician as well as via chart review.  In the setting of baseline dementia, the staff at [facility] feel that the patient has been more confused relative to this baseline over the course of the last 2 days. They reportedly have not detected significant somnolence over that time, but rather believe that patient is exhibiting worsening confusion over that timeframe.This is been associated with decline in oral intake over the last 2 to 3 days, in the absence of any reported vomiting or diarrhea. No evidence of objective fever at SNF.No reported recent trauma. No evidence of respiratory distress or cough.  Patient's medical history is notable for history of essential hypertension, for which he is on benazepril as a sole outpatient antihypertensive agent. In the setting of Parkinson's disease, he is also on levodopa/carbidopa. Does not appear to be on any testing as an outpatient.   Assessment and Plan: Principal Problem:   Hyperkalemia Active Problems:   AKI (acute kidney injury) (HCC)   Dehydration   Acute metabolic encephalopathy   Hypertension   Parkinson's disease (HCC)  #Hyperkalemia, resolved Acute, was upper limit of normal last month. Suspect 2/2 dehydration (report of decreased po) and acei use. 6.6  on presentation. Received calcium, bicarb, and several rounds of insulin w/ dextrose. Nephrology consulted 3/6 for persistent hyperkalemia. K now wnl. Think most likely 2/2 acei - holding veltassa - will d/c fluids - renin, aldosterone still pending - d/c telemetry - will f/u additional nephrology recs when available - holding home acei, topical nsaid - likely d/c tomorrow assuming k remains wnl. Pt/ot consulted, rec snf vs 24/7 supervision (currently in assisted living),  discussed with case manager, awaiting for placement  # Hypothermia, resolved # Hypotension resolved, now HTN # Leukopenia, resolved Infectious w/u negative. Given empiric vanc/zosyn which were d/c'd  - monitor  # Aspiration risk SLP following, says review of previous SLP notes shows patient has a hx of silent aspiration - monitor - aspiration precautions  # AKI Resolved with fluids  # Acute metabolic encephalopathy Baseline parkinsons dementia. ua and cxr not suggestive of infection. CT head w/o acute findings. Hyperkalemia likely contributory, no other electrolyte abnormalities. covid neg. Mental status improved - monitor  #Essential hypertension -Discontinued benazepril due to hyperkalemia and AKI  3/9 Started amlodipine 10 mg p.o. daily    # Parkinson's - re-started sinimet and   # Subclinical hypothyroidism - defer starting tx given age  # Normocytic anemia # Iron deficiency anemia Iron deficient, unremarkable smear and b12/folate. No report of hematochezia or melena. Hgb stable - started oral iron - monitor hgb  # Thrombocytopenia Mild, stable. Hiv, hcv, smear unremarkable - monitor  # Pressure ulcer right heal Bandaged, wound nurse following   Body mass index is 23.6 kg/m.       Pressure Injury 06/04/20 Ankle Anterior;Left (Active)  06/04/20 1055  Location: Ankle  Location Orientation: Anterior;Left  Staging:   Wound Description (Comments):   Present on Admission: Yes      Diet: Dysphagia 1 nectar thick liquids DVT Prophylaxis: Subcutaneous Lovenox   Advance goals of care discussion: DNR  Family Communication: family was NOT present at bedside, at the time of interview.  The pt provided permission to discuss medical plan with the family. Opportunity was given to ask question and all questions were answered satisfactorily.   Disposition:  Pt is from ALF, admitted with hyperkalemia encephalopathy and history of Parkinson's, dementia, still has Parkinson, which precludes a safe discharge to ALF Discharge to SNF, patient needs 24/7 supervision, high risk for fall and readmission.  Awaiting for placement, social worker and case manager are aware   Subjective: No overnight issues, patient denies any complaints, laying comfortably.  Hard of hearing  Physical Exam: General:  Awake, NAD.  Appear in no distress, affect appropriate Eyes: PERRLA ENT: Oral Mucosa Clear, moist  Neck: no JVD,  Cardiovascular: S1 and S2 Present, no Murmur,  Respiratory: good respiratory effort, Bilateral Air entry equal and Decreased, no Crackles, no wheezes Abdomen: Bowel Sound present, Soft and no tenderness,  Skin: no rashes Extremities: no Pedal edema, no calf tenderness Neurologic: without any new focal findings, Parkinson disease, tremors and rigidity Gait not checked due to patient safety concerns  Vitals:   06/08/20 0005 06/08/20 0514 06/08/20 0627 06/08/20 0824  BP: (!) 153/88 (!) 166/90  (!) 162/71  Pulse: 68 63  67  Resp: 16 14  15   Temp: 97.8 F (36.6 C) 97.7 F (36.5 C)  97.6 F (36.4 C)  TempSrc: Axillary Axillary  Axillary  SpO2: 94% 96%  94%  Weight:   74.6 kg   Height:        Intake/Output Summary (Last 24 hours) at 06/08/2020 1152 Last data filed at 06/08/2020 0954 Gross per 24 hour  Intake 0 ml  Output 1500 ml  Net -1500 ml   Filed Weights   06/06/20 0100 06/07/20 0324 06/08/20 0627  Weight: 77.7 kg 76 kg 74.6 kg    Data Reviewed: I have  personally reviewed and interpreted daily labs, tele strips, imagings as discussed above. I reviewed all nursing notes, pharmacy notes, vitals, pertinent old records I have discussed plan of care as described above with RN and patient/family.  CBC: Recent Labs  Lab 06/05/20 0505 06/05/20 1114 06/06/20 0425 06/07/20 0420 06/08/20 0506  WBC 3.8* 3.7* 5.3 6.2 6.1  NEUTROABS  --  2.8 3.5 4.9 4.7  HGB 9.2* 8.4* 8.9* 9.7* 9.6*  HCT 28.7* 26.9* 28.8* 29.9* 29.1*  MCV 87.5 89.7 88.9 86.7 85.3  PLT 114* 120* 121* 119* 129*   Basic Metabolic Panel: Recent Labs  Lab 06/02/20 1917 06/02/20 2306 06/03/20 0439 06/03/20 1217 06/05/20 0505 06/05/20 1114 06/06/20 0425 06/07/20 0420 06/08/20 0506  NA 135   < > 140   < > 139 138 140 139 140  K 6.7*   < > 6.0*   < > 5.8* 5.9* 5.1 4.4 3.9  CL 110   < > 111   < > 115* 115* 116* 114* 113*  CO2 21*   < > 24   < > 21* 20* 21* 20* 21*  GLUCOSE 81   < > 89   < > 72 103* 85 114* 88  BUN 38*   < > 31*   < > 23 24* 27* 21 17  CREATININE 0.93   < > 0.71   < > 0.65 0.77 1.00  0.86 0.68  CALCIUM 9.0   < > 8.8*   < > 8.9 8.6* 8.9 9.1 9.2  MG 2.2  --  2.0  --   --   --   --   --  1.7  PHOS  --   --   --   --   --   --   --   --  2.7   < > = values in this interval not displayed.    Studies: No results found.  Scheduled Meds: . amLODipine  5 mg Oral Daily  . benazepril  15 mg Oral q AM  . carbidopa-levodopa  1 tablet Oral TID AC  . Chlorhexidine Gluconate Cloth  6 each Topical Q0600  . enoxaparin (LOVENOX) injection  40 mg Subcutaneous Q24H  . ferrous sulfate  325 mg Oral Q48H  . mupirocin ointment  1 application Nasal BID   Continuous Infusions: PRN Meds:   Time spent: 35 minutes  Author: Gillis Santa. MD [facility] 06/08/2020 11:52 AM  To reach On-call, see care teams to locate the attending and reach out to them via [email or web address]. If 7PM-7AM, please contact night-coverage If you still have difficulty reaching the attending  provider, please page the [facility] (Director on Call) for [facility] on amion for assistance.

## 2020-06-08 NOTE — Progress Notes (Signed)
Occupational Therapy Treatment Patient Details Name: Keith Hicks MRN: [id number] DOB: 17-Aug-1924 Today's Date: 06/08/2020    History of present illness Pt is a 87 y.o. male with medical history significant for dementia, Parkinson's disease, hypertension, who is admitted with hyperkalemia after presenting from ALF to [facility] ED for evaluation of altered mental status.  MD assessment includes: Hyperkalemia, hypothermia, hypotension, leukopenia, AKI, anemia, pressure ulcer to L lateral malleolus and acute metabolic encephalopathy.   OT comments  Pt seen for OT treatment this date to f/u re: safety with ADLs/ADL mobility. OT provides TOTAL A repositioning to optimize pt positioning in bed in prep for UB ADLs in high-fowler's position. Pt requires TOTAL A to shift UB weight to the L and OT places pillows to provide support as he demos a R lateral lean. Pt requires TOTAL A for propulsion to [facility] as well to increase his heigh over bed side table so he could see and access necessary ADL items. Pt requires MAX A for self feeding/drinking with thickened liquids including hand over hand, increased processing time and multimodal cues to meaningfully engage. Pt with very limited verbal communication with OT throughout this session. He does also participate in MAX A grooming tasks bed level with HOB elevated. Will continue to follow acutely and continue to anticipate that pt will require STR in SNF setting.    Follow Up Recommendations  SNF    Equipment Recommendations  Other (comment) (defer)    Recommendations for Other Services      Precautions / Restrictions Precautions Precautions: Fall Restrictions Weight Bearing Restrictions: No Other Position/Activity Restrictions: Prevalon boots ordered       Mobility Bed Mobility         General bed mobility comments: attempted to engage pt in sup to sit, but pt with essentially no command following this date and requires extensive assist to participate  in bed level ADLs.    Transfers             General transfer comment: NT    Balance                         ADL either performed or assessed with clinical judgement   ADL Overall ADL's : Needs assistance/impaired Eating/Feeding: Maximal assistance;Bed level Eating/Feeding Details (indicate cue type and reason): HOB elevated, hand over hand, increased time, MOD/MAX multimodal cues for pt to attend and sequence Grooming: Wash/dry face;Maximal assistance;Bed level Grooming Details (indicate cue type and reason): HOB elevated, increased time to process, multimodal cues to sequence, hand over hand for task initiation.                       Vision   Additional Comments: unable to formally assess, pt with notably dry and blood shot eyes and minimal bllinking-requiring cues to blink his eyes. Appears to sometimes track appropriately, but limited tracking assessment as pt with limited visual attention.   Perception     Praxis      Cognition Arousal/Alertness: Lethargic Behavior During Therapy: Flat affect Overall Cognitive Status: Impaired/Different from baseline                 General Comments: Pt with limited communication/verbalizations to OT during and throughout session this date, limited visual attention.        Exercises Other Exercises Other Exercises: self feeding/drinking with thickened liquids, extended time and cues, MAX hand over hand.   Shoulder Instructions  General Comments      Pertinent Vitals/ Pain       Pain Assessment: Faces Faces Pain Scale: Hurts a little bit Pain Location: LLE Pain Descriptors / Indicators: Grimacing Pain Intervention(s): Limited activity within patient's tolerance;Repositioned (pillows placed under R UE to improve alignment in bed)  Home Living                Prior Functioning/Environment              Frequency  Min 1X/week        Progress Toward Goals  OT Goals(current  goals can now be found in the care plan section)  Progress towards OT goals: OT to reassess next treatment  Acute Rehab OT Goals Patient Stated Goal: none stated OT Goal Formulation: Patient unable to participate in goal setting Time For Goal Achievement: 06/21/20 Potential to Achieve Goals: Fair  Plan Discharge plan remains appropriate    Co-evaluation                 AM-PAC OT "6 Clicks" Daily Activity     Outcome Measure   Help from another person eating meals?: A Lot Help from another person taking care of personal grooming?: A Lot Help from another person toileting, which includes using toliet, bedpan, or urinal?: A Lot Help from another person bathing (including washing, rinsing, drying)?: A Lot Help from another person to put on and taking off regular upper body clothing?: A Lot Help from another person to put on and taking off regular lower body clothing?: Total 6 Click Score: 11    End of Session    OT Visit Diagnosis: Muscle weakness (generalized) (M62.81);Other abnormalities of gait and mobility (R26.89);Other symptoms and signs involving cognitive function   Activity Tolerance Patient limited by fatigue;Patient limited by lethargy   Patient Left in bed;with call bell/phone within reach;with bed alarm set   Nurse Communication          Time: 3299-2426 OT Time Calculation (min): 23 min  Charges: OT General Charges $OT Visit: 1 Visit OT Treatments $Self Care/Home Management : 23-37 mins  Rejeana Brock, MS, OTR/L ascom [phone] 06/08/20, 6:06 PM

## 2020-06-08 NOTE — Progress Notes (Signed)
Dr Lucianne Muss said that the patient does not need to be on telemetry

## 2020-06-08 NOTE — Progress Notes (Signed)
Patient transferred from [facility].

## 2020-06-09 DIAGNOSIS — G9341 Metabolic encephalopathy: Secondary | ICD-10-CM | POA: Diagnosis not present

## 2020-06-09 DIAGNOSIS — Z7189 Other specified counseling: Secondary | ICD-10-CM

## 2020-06-09 DIAGNOSIS — E875 Hyperkalemia: Secondary | ICD-10-CM | POA: Diagnosis not present

## 2020-06-09 DIAGNOSIS — Z515 Encounter for palliative care: Secondary | ICD-10-CM

## 2020-06-09 DIAGNOSIS — N179 Acute kidney failure, unspecified: Secondary | ICD-10-CM | POA: Diagnosis not present

## 2020-06-09 DIAGNOSIS — E860 Dehydration: Secondary | ICD-10-CM | POA: Diagnosis not present

## 2020-06-09 LAB — CBC WITH DIFFERENTIAL/PLATELET
Abs Immature Granulocytes: 0.02 10*3/uL (ref 0.00–0.07)
Basophils Absolute: 0 10*3/uL (ref 0.0–0.1)
Basophils Relative: 0 %
Eosinophils Absolute: 0.1 10*3/uL (ref 0.0–0.5)
Eosinophils Relative: 3 %
HCT: 28.7 % — ABNORMAL LOW (ref 39.0–52.0)
Hemoglobin: 9.3 g/dL — ABNORMAL LOW (ref 13.0–17.0)
Immature Granulocytes: 0 %
Lymphocytes Relative: 11 %
Lymphs Abs: 0.6 10*3/uL — ABNORMAL LOW (ref 0.7–4.0)
MCH: 28.1 pg (ref 26.0–34.0)
MCHC: 32.4 g/dL (ref 30.0–36.0)
MCV: 86.7 fL (ref 80.0–100.0)
Monocytes Absolute: 0.5 10*3/uL (ref 0.1–1.0)
Monocytes Relative: 10 %
Neutro Abs: 3.9 10*3/uL (ref 1.7–7.7)
Neutrophils Relative %: 76 %
Platelets: 144 10*3/uL — ABNORMAL LOW (ref 150–400)
RBC: 3.31 MIL/uL — ABNORMAL LOW (ref 4.22–5.81)
RDW: 16.5 % — ABNORMAL HIGH (ref 11.5–15.5)
WBC: 5.2 10*3/uL (ref 4.0–10.5)
nRBC: 0 % (ref 0.0–0.2)

## 2020-06-09 LAB — BASIC METABOLIC PANEL
Anion gap: 7 (ref 5–15)
BUN: 23 mg/dL (ref 8–23)
CO2: 24 mmol/L (ref 22–32)
Calcium: 8.9 mg/dL (ref 8.9–10.3)
Chloride: 112 mmol/L — ABNORMAL HIGH (ref 98–111)
Creatinine, Ser: 0.77 mg/dL (ref 0.61–1.24)
GFR, Estimated: >60 mL/min (ref >60)
Glucose, Bld: 76 mg/dL (ref 70–99)
Potassium: 3.7 mmol/L (ref 3.5–5.1)
Sodium: 143 mmol/L (ref 135–145)

## 2020-06-09 LAB — URINALYSIS, ROUTINE W REFLEX MICROSCOPIC
Bilirubin Urine: NEGATIVE
Glucose, UA: NEGATIVE mg/dL
Hgb urine dipstick: NEGATIVE
Ketones, ur: 5 mg/dL — AB
Leukocytes,Ua: NEGATIVE
Nitrite: NEGATIVE
Protein, ur: NEGATIVE mg/dL
Specific Gravity, Urine: 1.016 (ref 1.005–1.030)
pH: 5 (ref 5.0–8.0)

## 2020-06-09 LAB — MAGNESIUM: Magnesium: 1.8 mg/dL (ref 1.7–2.4)

## 2020-06-09 LAB — PHOSPHORUS: Phosphorus: 3.4 mg/dL (ref 2.5–4.6)

## 2020-06-09 NOTE — Progress Notes (Signed)
[facility] Progress Note  Patient: Keith Hicks    MVH:[id number]  DOA: 06/02/2020     Date of Service: the patient was seen and examined on 06/09/2020  Chief Complaint  Patient presents with  . Failure To Thrive   Brief hospital course: From admission hpi Keith Hicks a 87 y.o.malewith medical history significant fordementia, Parkinson's disease, hypertension,who is admitted to [facility] on 3/3/2022with hyperkalemiaafter presenting from [facility] ED for evaluation of altered mental status.  In the context of the patient's altered mental status, the following historywas obtained via my discussions withthe emergency department physician as well as via chart review.  In the setting of baseline dementia, the staff at [facility] feel that the patient has been more confused relative to this baseline over the course of the last 2 days. They reportedly have not detected significant somnolence over that time, but rather believe that patient is exhibiting worsening confusion over that timeframe.This is been associated with decline in oral intake over the last 2 to 3 days, in the absence of any reported vomiting or diarrhea. No evidence of objective fever at SNF.No reported recent trauma. No evidence of respiratory distress or cough.  Patient's medical history is notable for history of essential hypertension, for which he is on benazepril as a sole outpatient antihypertensive agent. In the setting of Parkinson's disease, he is also on levodopa/carbidopa. Does not appear to be on any testing as an outpatient.   Assessment and Plan: Principal Problem:   Hyperkalemia Active Problems:   AKI (acute kidney injury) (HCC)   Dehydration   Acute metabolic encephalopathy   Hypertension   Parkinson's disease (HCC)  #Hyperkalemia, resolved Acute, was upper limit of normal last month. Suspect 2/2 dehydration (report of decreased po) and acei use. 6.6  on presentation. Received calcium, bicarb, and several rounds of insulin w/ dextrose. Nephrology consulted 3/6 for persistent hyperkalemia. K now wnl. Think most likely 2/2 acei - holding veltassa - will d/c fluids - renin, aldosterone still pending till 3/10 12:20 noon - d/c telemetry - will f/u additional nephrology recs when available - holding home acei, topical nsaid - Pt/ot consulted, rec snf vs 24/7 supervision (currently in assisted living),  discussed with case manager, awaiting for placement  # Hypothermia, resolved # Hypotension resolved, now HTN # Leukopenia, resolved Infectious w/u negative. Given empiric vanc/zosyn which were d/c'd  - monitor  # Aspiration risk SLP following, says review of previous SLP notes shows patient has a hx of silent aspiration - monitor - aspiration precautions  # AKI Resolved with fluids  # Acute metabolic encephalopathy Baseline parkinsons dementia. ua and cxr not suggestive of infection. CT head w/o acute findings. Hyperkalemia likely contributory, no other electrolyte abnormalities. covid neg. Mental status improved - monitor  #Essential hypertension -Discontinued benazepril due to hyperkalemia and AKI  3/9 Started amlodipine 10 mg p.o. daily    # Parkinson's - re-started sinimet and   # Subclinical hypothyroidism - defer starting tx given age  # Normocytic anemia # Iron deficiency anemia Iron deficient, unremarkable smear and b12/folate. No report of hematochezia or melena. Hgb stable - started oral iron - monitor hgb  # Thrombocytopenia Mild, stable. Hiv, hcv, smear unremarkable - monitor  # Pressure ulcer right heal Bandaged, wound nurse following   Body mass index is 23.6 kg/m.    Overall prognosis is very poor, patient's mental status is declining due to underlying Parkinson disease and advanced age.  DNR, palliative care consulted  to discuss goals of care with family, patient may be appropriate for  hospice care down the road.   Pressure Injury 06/04/20 Ankle Anterior;Left (Active)  06/04/20 1055  Location: Ankle  Location Orientation: Anterior;Left  Staging:   Wound Description (Comments):   Present on Admission: Yes     Diet: Dysphagia 1 nectar thick liquids DVT Prophylaxis: Subcutaneous Lovenox   Advance goals of care discussion: DNR  Family Communication: family was NOT present at bedside, at the time of interview.  Patient has underlying dementia unable to offer any complaints and communicate well enough.  Palliative care consulted to discuss with family regarding goals of care, possible hospice.  Disposition:  Pt is from ALF, admitted with hyperkalemia encephalopathy and history of Parkinson's, dementia, still has Parkinson, which precludes a safe discharge to ALF Discharge to SNF, patient needs 24/7 supervision, high risk for fall and readmission.  Awaiting for placement, social worker and case manager are aware   Subjective: No overnight issues, patient denies any complaints, laying comfortably.  Hard of hearing Patient was able to take p.o. medications with RN  Physical Exam: General:  Awake, NAD.  Appear in no distress, affect appropriate Eyes: PERRLA ENT: Oral Mucosa Clear, moist  Neck: no JVD,  Cardiovascular: S1 and S2 Present, no Murmur,  Respiratory: good respiratory effort, Bilateral Air entry equal and Decreased, no Crackles, no wheezes Abdomen: Bowel Sound present, Soft and no tenderness,  Skin: no rashes Extremities: no Pedal edema, no calf tenderness Neurologic: without any new focal findings, Parkinson disease, tremors and rigidity Gait not checked due to patient safety concerns  Vitals:   06/09/20 0427 06/09/20 0431 06/09/20 0724 06/09/20 1134  BP: (!) 128/59  (!) 124/53 (!) 105/48  Pulse: 63  (!) 58 63  Resp: 18  14 14   Temp: (!) 97.5 F (36.4 C)  98.3 F (36.8 C) 98.6 F (37 C)  TempSrc: Oral  Oral Axillary  SpO2: 93%  96% 93%  Weight:   72.2 kg    Height:        Intake/Output Summary (Last 24 hours) at 06/09/2020 1218 Last data filed at 06/09/2020 1025 Gross per 24 hour  Intake 370 ml  Output 150 ml  Net 220 ml   Filed Weights   06/07/20 0324 06/08/20 0627 06/09/20 0431  Weight: 76 kg 74.6 kg 72.2 kg    Data Reviewed: I have personally reviewed and interpreted daily labs, tele strips, imagings as discussed above. I reviewed all nursing notes, pharmacy notes, vitals, pertinent old records I have discussed plan of care as described above with RN and patient/family.  CBC: Recent Labs  Lab 06/05/20 1114 06/06/20 0425 06/07/20 0420 06/08/20 0506 06/09/20 0558  WBC 3.7* 5.3 6.2 6.1 5.2  NEUTROABS 2.8 3.5 4.9 4.7 3.9  HGB 8.4* 8.9* 9.7* 9.6* 9.3*  HCT 26.9* 28.8* 29.9* 29.1* 28.7*  MCV 89.7 88.9 86.7 85.3 86.7  PLT 120* 121* 119* 129* 144*   Basic Metabolic Panel: Recent Labs  Lab 06/02/20 1917 06/02/20 2306 06/03/20 0439 06/03/20 1217 06/05/20 1114 06/06/20 0425 06/07/20 0420 06/08/20 0506 06/09/20 0558  NA 135   < > 140   < > 138 140 139 140 143  K 6.7*   < > 6.0*   < > 5.9* 5.1 4.4 3.9 3.7  CL 110   < > 111   < > 115* 116* 114* 113* 112*  CO2 21*   < > 24   < > 20* 21* 20* 21* 24  GLUCOSE 81   < > 89   < > 103* 85 114* 88 76  BUN 38*   < > 31*   < > 24* 27* 21 17 23   CREATININE 0.93   < > 0.71   < > 0.77 1.00 0.86 0.68 0.77  CALCIUM 9.0   < > 8.8*   < > 8.6* 8.9 9.1 9.2 8.9  MG 2.2  --  2.0  --   --   --   --  1.7 1.8  PHOS  --   --   --   --   --   --   --  2.7 3.4   < > = values in this interval not displayed.    Studies: No results found.  Scheduled Meds: . amLODipine  5 mg Oral Daily  . carbidopa-levodopa  1 tablet Oral TID AC  . Chlorhexidine Gluconate Cloth  6 each Topical Q0600  . enoxaparin (LOVENOX) injection  40 mg Subcutaneous Q24H  . ferrous sulfate  325 mg Oral Q48H  . mupirocin ointment  1 application Nasal BID   Continuous Infusions: PRN Meds:   Time spent: 35  minutes  Author: . MD [facility] 06/09/2020 12:18 PM  To reach On-call, see care teams to locate the attending and reach out to them via [email or web address]/09/2020. If 7PM-7AM, please contact night-coverage If you still have difficulty reaching the attending provider, please page the [facility] (Director on Call) for [facility] on amion for assistance.

## 2020-06-09 NOTE — TOC Initial Note (Addendum)
Transition of Care [facility]) - Initial/Assessment Note    Patient Details  Name: Keith Hicks MRN: [id number] Date of Birth: [birth year]  Transition of Care [facility]) CM/SW Contact:    Margarito Liner, LCSW Phone Number: 06/09/2020, 10:54 AM  Clinical Narrative:    Patient only oriented to self. No supports at bedside. CSW called patient's son, introduced role, and explained that PT recommendations would be discussed. Patient's son confirmed he is from [facility]F. His wife also lives there on the Memory Care Unit. Discussed SNF recommendation. Son agreeable to bed search. Patient's wife went to [facility] last year and had a good experience. Son explained patient's decline both mentally and physically over the past two weeks, most dramatically over the past 2-3 days. Patient now requiring total care. Per PT eval, "pt ambulatory limited facility distances such as bed to bathroom with a RW up until "several weeks" ago." Son reported patient began hallucinating around the start of his decline as well. For example, when son was leaving the room patient told him to take a chocolate cake with him. Patient reported people working under the floor. Son also stated patient will act like he's feeding himself, holding invisible food. Discussed potential for goals of care discuss with palliative. Son is in agreement. Sent secure chat to MD requesting consult. Will call [facility] today to determine if they could meet his needs at their facility or if patient would need to go to SNF. Will send out SNF referral today to prepare for potential discharge to this venue. No further concerns. CSW encouraged patient's son to contact CSW as needed. CSW will continue to follow patient and his son for support and facilitate discharge once medically stable.              2:38 pm: [facility] unable to accept patient patient with hospice services if prognosis is more than a few days due to level of assistance  patient requires. They only have one caregiver in the facility for the residents and it would be difficult to pull that caregiver aware for 30-45 minutes for mealtimes. Made son aware and discussed SNF with hospice. Explained that Medicare would pay for the hospice services but that room and board would be private pay. Son stated that cost may be around the same price they are paying for [facility]. Will provide bed offers and private pay costs when available.  Expected Discharge Plan:  (TBD) Barriers to Discharge: Continued Medical Work up   Patient Goals and CMS Choice        Expected Discharge Plan and Services Expected Discharge Plan:  (TBD)     Post Acute Care Choice:  (TBD) Living arrangements for the past 2 months: Assisted Living Facility                                      Prior Living Arrangements/Services Living arrangements for the past 2 months: Assisted Living Facility Lives with:: Facility Resident Patient language and need for interpreter reviewed:: Yes Do you feel safe going back to the place where you live?: Yes      Need for Family Participation in Patient Care: Yes (Comment) Care giver support system in place?: Yes (comment)   Criminal Activity/Legal Involvement Pertinent to Current Situation/Hospitalization: No - Comment as needed  Activities of Daily Living      Permission Sought/Granted Permission sought to  share information with : Facility Contact Representative,Family Supports    Share Information with NAME: Giovany Cosby  Permission granted to share info w AGENCY: [facility], SNF's  Permission granted to share info w Relationship: Son  Permission granted to share info w Contact Information: [phone]  Emotional Assessment Appearance:: Appears stated age Attitude/Demeanor/Rapport: Unable to Assess Affect (typically observed): Unable to Assess Orientation: : Oriented to Self Alcohol / Substance Use: Not Applicable Psych  Involvement: No (comment)  Admission diagnosis:  Hyperkalemia [E87.5] Patient Active Problem List   Diagnosis Date Noted  . Hyperkalemia 06/02/2020  . AKI (acute kidney injury) (HCC) 06/02/2020  . Dehydration 06/02/2020  . Acute metabolic encephalopathy 06/02/2020  . Hypertension   . Parkinson's disease (HCC)    PCP:  [facility]:   [facility] - [place], [place] - [address] [place] [address] Phone: [phone] Fax: [phone]     Social Determinants of Health (SDOH) Interventions    Readmission Risk Interventions No flowsheet data found.

## 2020-06-09 NOTE — Progress Notes (Signed)
Physical Therapy Treatment Patient Details Name: Keith Hicks MRN: [id number] DOB: Jul 06, 1924 Today's Date: 06/09/2020    History of Present Illness Pt is a 87 y.o. male with medical history significant for dementia, Parkinson's disease, hypertension, who is admitted with hyperkalemia after presenting from ALF to [facility] ED for evaluation of altered mental status.  MD assessment includes: Hyperkalemia, hypothermia, hypotension, leukopenia, AKI, anemia, pressure ulcer to L lateral malleolus and acute metabolic encephalopathy.    PT Comments    Pt received upright in bed and agreeable to therapy.   Pt was able to participate in bed-level exercises with verbal and tactile cuing.  Pt required AAROM for some of the exercises as noted below, however was able to follow one-step commands much better during today's session.  PT then seen for PT/OT co-treat for patient safety and to assist in mobility for the pt.  Pt required modA +2 for supine to sit, and minA for remaining upright in seated position.  Pt utilized UE's to hold onto for support in sitting, but eventually required assistance from therapist to keep upright.  PT and OT assisted pt in standing x4 attempts, for pericare and bed change due to bowel movement on the bed.  Pt required more and more assistance for each following attempt, ultimately requiring maxA x2.  Nurse tech assisted on the final stand with bed change and cleaning.  Pt transferred back to bed with HOB elevted, boot donned, and all needs within reach of pt.  Nursing staff in room upon leaving to replaced condom catheter.  Nurse notified of progress and stated that palliative care may be called in for pt.  Current discharge plans to SNF remain appropriate at this time.  Pt will continue to benefit from skilled therapy in order to address deficits listed below.     Follow Up Recommendations  SNF;Supervision/Assistance - 24 hour     Equipment Recommendations  Other (comment) (TBD at  next venue of care)    Recommendations for Other Services       Precautions / Restrictions Precautions Precautions: Fall Restrictions Weight Bearing Restrictions: No Other Position/Activity Restrictions: Prevalon boots ordered    Mobility  Bed Mobility Overal bed mobility: Needs Assistance Bed Mobility: Supine to Sit;Sit to Supine     Supine to sit: Mod assist;+2 for physical assistance;HOB elevated Sit to supine: Max assist;Total assist;+2 for physical assistance   General bed mobility comments: increased time, MOD multimodal cues to sequence    Transfers Overall transfer level: Needs assistance Equipment used: Rolling walker (2 wheeled) Transfers: Sit to/from Stand Sit to Stand: Max assist;+2 physical assistance;From elevated surface         General transfer comment: Pt seated EOB, arm in arm stand attempted first with pt requiring ~MOD A +2, Pt requires RW and MOD A +2 on seoncd stand trial as well and tolerates for ~10 seconds, but is noted to keep hips moderately flexed requiring tactile/verbal cues to attempt to extend to full stand. Subsequent standing trials 3 and 4 completed out of necessity to get pt clean for skin protection and pt fatiguing requirng MAX A +2 from elevated EOB surface on subsequent trials.  Ambulation/Gait             General Gait Details: Unable/unsafe to attempt   Stairs             Wheelchair Mobility    Modified Rankin (Stroke Patients Only)       Balance Overall balance assessment: Needs assistance Sitting-balance  support: Bilateral upper extremity supported;Feet supported Sitting balance-Leahy Scale: Poor Sitting balance - Comments: L lateral lean, initailly F balance with UE support, but gradually fatigues requiring at least MIN A to sustain static sitting balance. Postural control: Left lateral lean   Standing balance-Leahy Scale: Zero Standing balance comment: MAX A +2 to CTS as well as maintain static stand with  UE support.                            Cognition Arousal/Alertness: Awake/alert Behavior During Therapy: Flat affect Overall Cognitive Status: No family/caregiver present to determine baseline cognitive functioning                                 General Comments: Pt with some improved engagement with PT/OT this session including ansering ~20-25% of questions somewhat appropriately and demonstrating ~30-40% visual attention throughout session. Pt able to follow simple one step commands ~40-50% of the time and makes meaningful efforts to participate this date and is generally more aware. Pt still only oriented to self. Presents with decreased ability to sequence and motor plan ADLs and fxl transfers.      Exercises Total Joint Exercises Ankle Circles/Pumps: AROM;Strengthening;Both;10 reps;Supine Quad Sets: AROM;Strengthening;Both;10 reps;Supine Heel Slides: AROM;Strengthening;Both;10 reps;Supine Hip ABduction/ADduction: AAROM;Strengthening;Both;10 reps;Supine Straight Leg Raises: AAROM;Strengthening;Both;10 reps;Supine    General Comments        Pertinent Vitals/Pain Pain Assessment: Faces Faces Pain Scale: No hurt    Home Living                      Prior Function            PT Goals (current goals can now be found in the care plan section) Acute Rehab PT Goals Patient Stated Goal: none stated Time For Goal Achievement: 06/20/20 Potential to Achieve Goals: Fair    Frequency    Min 2X/week      PT Plan      Co-evaluation   Reason for Co-Treatment: Complexity of the patient's impairments (multi-system involvement);Necessary to address cognition/behavior during functional activity;For patient/therapist safety;To address functional/ADL transfers PT goals addressed during session: Mobility/safety with mobility;Balance;Strengthening/ROM OT goals addressed during session: ADL's and self-care;Proper use of Adaptive equipment and DME       AM-PAC PT "6 Clicks" Mobility   Outcome Measure  Help needed turning from your back to your side while in a flat bed without using bedrails?: Total Help needed moving from lying on your back to sitting on the side of a flat bed without using bedrails?: Total Help needed moving to and from a bed to a chair (including a wheelchair)?: Total Help needed standing up from a chair using your arms (e.g., wheelchair or bedside chair)?: Total Help needed to walk in hospital room?: Total Help needed climbing 3-5 steps with a railing? : Total 6 Click Score: 6    End of Session Equipment Utilized During Treatment: Gait belt Activity Tolerance: Patient tolerated treatment well Patient left: in bed;with call bell/phone within reach;with bed alarm set;with nursing/sitter in room Nurse Communication: Mobility status PT Visit Diagnosis: Difficulty in walking, not elsewhere classified (R26.2);Muscle weakness (generalized) (M62.81)     Time: 1749-4496 PT Time Calculation (min) (ACUTE ONLY): 44 min  Charges:  $Therapeutic Exercise: 8-22 mins  Nolon Bussing, PT, DPT 06/09/20, 5:59 PM

## 2020-06-09 NOTE — NC FL2 (Signed)
[place] MEDICAID FL2 LEVEL OF CARE SCREENING TOOL     IDENTIFICATION  Patient Name: Keith Hicks Birthdate: 06/23/1924 Sex: male Admission Date (Current Location): 06/02/2020  [place] and [place] Number:  Chiropodist and Address:  [facility], [address] [place], [place] [address]      Provider Number: [id number]  Attending Physician Name and Address:  Gillis Santa, MD  Relative Name and Phone Number:       Current Level of Care: Hospital Recommended Level of Care: Skilled Nursing Facility (with hospice) Prior Approval Number:    Date Approved/Denied:   PASRR Number: [id number]  Discharge Plan: SNF (with hospice)    Current Diagnoses: Patient Active Problem List   Diagnosis Date Noted  . Palliative care by specialist   . Goals of care, counseling/discussion   . Hyperkalemia 06/02/2020  . Acute renal failure (HCC) 06/02/2020  . Dehydration 06/02/2020  . Acute metabolic encephalopathy 06/02/2020  . Hypertension   . Parkinson's disease (HCC)     Orientation RESPIRATION BLADDER Height & Weight     Self  Normal Incontinent,External catheter Weight: 159 lb 2.8 oz (72.2 kg) Height:  5\' 10"  (177.8 cm)  BEHAVIORAL SYMPTOMS/MOOD NEUROLOGICAL BOWEL NUTRITION STATUS   (None)  (Parkinson's Disease) Incontinent Diet (DYS 1. Fluid nectar thick. OK to send oatmeal, please add butter and sugar. No straws.)  AMBULATORY STATUS COMMUNICATION OF NEEDS Skin   Total Care Verbally Skin abrasions,Bruising,Other (Comment) (Pressur injury (no stage indicated) on left ankle: Foam every 3 days.)                       Personal Care Assistance Level of Assistance  Total care       Total Care Assistance: Maximum assistance   Functional Limitations Info  Sight,Hearing,Speech Sight Info: Adequate Hearing Info: Adequate Speech Info: Adequate (Delayed responses)    SPECIAL CARE FACTORS FREQUENCY                        Contractures Contractures Info: Not present    Additional Factors Info  Code Status,Allergies Code Status Info: DNR Allergies Info: NKDA           Current Medications (06/09/2020):  This is the current hospital active medication list Current Facility-Administered Medications  Medication Dose Route Frequency Provider Last Rate Last Admin  . amLODipine (NORVASC) tablet 5 mg  5 mg Oral Daily 08/09/2020, MD   5 mg at 06/09/20 0931  . carbidopa-levodopa (SINEMET IR) 25-100 MG per tablet immediate release 1 tablet  1 tablet Oral TID AC Wouk, 08/09/20, MD   1 tablet at 06/09/20 1247  . Chlorhexidine Gluconate Cloth 2 % PADS 6 each  6 each Topical Q0600 08/09/20, MD   6 each at 06/09/20 0425  . enoxaparin (LOVENOX) injection 40 mg  40 mg Subcutaneous Q24H Wouk, 08/09/20, MD   40 mg at 06/08/20 2000  . ferrous sulfate tablet 325 mg  325 mg Oral Q48H Wouk, 08/08/20, MD   325 mg at 06/08/20 1304  . mupirocin ointment (BACTROBAN) 2 % 1 application  1 application Nasal BID 08/08/20, MD   1 application at 06/09/20 [id number]     Discharge Medications: Please see discharge summary for a list of discharge medications.  Relevant Imaging Results:  Relevant Lab Results:   Additional Information SS#: [id number]. From [facility]F. They are unable to take him back  with hospice services due to level of care he needs. Son aware he will need to private pay for room and board. Per palliative, prognosis probably around 6 months or less.  Margarito Liner, LCSW

## 2020-06-09 NOTE — Consult Note (Signed)
Consultation Note Date: 06/09/2020   Patient Name: Keith Hicks  DOB: 1925-03-14  MRN: [id number]  Age / Sex: 87 y.o., male  PCP: [facility] Referring Physician: Val Riles, MD  Reason for Consultation: Establishing goals of care  HPI/Patient Profile: 87 y.o. male  with past medical history of Parkinson's dementia, hypertension, hyperlipidemia admitted on 06/02/2020 with hyperkalemia and altered mental status. Workup negative for infection. CT head negative for acute findings. AKI, hyperkalemia, hypothermia, hypotension, and leukopenia resolved. SLP following and recommending dysphagia 1, nect thick diet due to silent aspiration. Son reports a significant decline in the last few weeks but especially the last 4 days. Palliative medicine consultation for goals of care.   Clinical Assessment and Goals of Care:  I have reviewed medical records, discussed with care team, and met patient at bedside. Keith Hicks can tell me his name, otherwise he is disoriented and does not engage greatly in conversation. He does not appear to be in pain or discomfort. Oxygen sats stable on room air. No family at bedside.  Spoke with son, Dominica Severin via telephone to discuss goals of care.   I introduced Palliative Medicine as specialized medical care for people living with serious illness. It focuses on providing relief from the symptoms and stress of a serious illness. The goal is to improve quality of life for both the patient and the family.  We discussed a brief life review of the patient. Married to wife for 65 years! His wife also lives at [facility] but in memory care with dementia. They have four children, three sons and one daughter. Son, Dominica Severin and his wife Mearl Latin are most involved in their parents care as the other children have medical conditions/spouses with medical conditions they tend to.   Dominica Severin shares that he  is documented financial POA for his father but does not have healthcare POA. Offered to have family conference with his siblings (for which Dominica Severin goes into detail about his siblings and their medical conditions) therefore Gary's siblings have always deferred decisions to [place]. "Take care of it."   Discussed events leading up to admission including his father's significant decline functional/cognitively in the last few weeks. He was diagnosed with Parkinson's in 2021 during a stay at the [facility] following a hip fracture.   Discussed course of hospitalization including diagnoses, interventions, plan of care. Discussed irreversible nature of his father's condition. Discussed PT/OT/SLP recommendations and silent aspiration.   I attempted to elicit values and goals of care important to the patient and son. Dominica Severin states "he would not want this" and that the patient never thought he would live this long. Dominica Severin confirms DNR code status and decisions against aggressive, life-prolonging measures if his father's condition continues to decline. Dominica Severin speaks of his priority for his father moving forward is too keep him "as comfortable as possible" and that Dominica Severin hates to see him suffer.   The difference between aggressive medical intervention and comfort care was discussed. Introduced outpatient palliative versus hospice options and philosophy. Dominica Severin is  interested in initiating hospice services on discharge. Hopeful that [facility] will consider accepting him back. Reassured Dominica Severin that I will notify SW to assist with disposition plan.   Questions addressed. PMT contact information given. Reassured of ongoing palliative support this admit. Also encouraged Dominica Severin to contact me if his siblings have any questions or concerns about the plan.    SUMMARY OF RECOMMENDATIONS    Continue current plan of care and medical management.   Continue dysphagia diet and comfort feeds. Patient needs assist and encouragement with meals.  Aspiration precautions.   Son, Dominica Severin is primary caregiver and [facility] for the patient. Dominica Severin does not wish to see his father suffer and his main goal is for his father to be as comfortable as possible. Dominica Severin is interested in initiating hospice services on discharge. SW following and will determine if [facility] will accept him back with hospice services. If not, will need SNF placement.   Code Status/Advance Care Planning:  DNR: confirmed by son.  Symptom Management:   Per attending  Palliative Prophylaxis:   Aspiration, Delirium Protocol, Frequent Pain Assessment, Oral Care and Turn Reposition  Psycho-social/Spiritual:   Desire for further Chaplaincy support: yes  Additional Recommendations: Caregiving  Support/Resources, Compassionate Wean Education and Education on Hospice  Prognosis:   Poor long-term prognosis with progressive Parkinson's dementia. Declining functional/cognitive/nutritional status  Discharge Planning: ALF with hospice vs. SNF with hospice on discharge.     Primary Diagnoses: Present on Admission: . Hyperkalemia . Dehydration . Acute metabolic encephalopathy . Hypertension . Parkinson's disease ([place])   I have reviewed the medical record, interviewed the patient and family, and examined the patient. The following aspects are pertinent.  Past Medical History:  Diagnosis Date  . Hyperlipemia   . Hypertension   . Parkinson's disease [facility])    Social History   Socioeconomic History  . Marital status: Married    Spouse name: Not on file  . Number of children: Not on file  . Years of education: Not on file  . Highest education level: Not on file  Occupational History  . Not on file  Tobacco Use  . Smoking status: Unknown If Ever Smoked  . Smokeless tobacco: Never Used  Substance and Sexual Activity  . Alcohol use: Not Currently  . Drug use: Never  . Sexual activity: Not Currently  Other Topics Concern  . Not on file  Social History Narrative   . Not on file   Social Determinants of Health   Financial Resource Strain: Not on file  Food Insecurity: Not on file  Transportation Needs: Not on file  Physical Activity: Not on file  Stress: Not on file  Social Connections: Not on file   History reviewed. No pertinent family history. Scheduled Meds: . amLODipine  5 mg Oral Daily  . carbidopa-levodopa  1 tablet Oral TID AC  . Chlorhexidine Gluconate Cloth  6 each Topical Q0600  . enoxaparin (LOVENOX) injection  40 mg Subcutaneous Q24H  . ferrous sulfate  325 mg Oral Q48H  . mupirocin ointment  1 application Nasal BID   Continuous Infusions: PRN Meds:. Medications Prior to Admission:  Prior to Admission medications   Medication Sig Start Date End Date Taking? Authorizing Provider  acetaminophen (TYLENOL) 650 MG CR tablet Take 650 mg by mouth 3 (three) times daily.   Yes [provider]  benazepril (LOTENSIN) 10 MG tablet Take 15 mg by mouth in the morning. 05/18/20  Yes [provider]  carbidopa-levodopa (SINEMET IR) 25-100  MG tablet Take 1 tablet by mouth 3 (three) times daily before meals. 05/18/20  Yes [provider]  diclofenac Sodium (VOLTAREN) 1 % GEL Apply 4 g topically 2 (two) times daily. 05/15/20  Yes [provider]   No Known Allergies Review of Systems  Unable to perform ROS: Mental status change   Physical Exam Vitals and nursing note reviewed.  Constitutional:      Appearance: He is ill-appearing.  HENT:     Head: Normocephalic and atraumatic.  Pulmonary:     Effort: No tachypnea, accessory muscle usage or respiratory distress.     Comments: Room air Skin:    General: Skin is warm and dry.     Comments: tremors  Neurological:     Mental Status: He is easily aroused.     Comments: Oriented to self, otherwise disoriented and does not engage in discussion.  Psychiatric:        Attention and Perception: He is inattentive.        Speech: Speech is delayed.         Cognition and Memory: Cognition is impaired.    Vital Signs: BP (!) 105/48 (BP Location: Left Arm)   Pulse 63   Temp 98.6 F (37 C) (Axillary)   Resp 14   Ht _0  (1.778 m)   Wt 72.2 kg   SpO2 93%   BMI 22.84 kg/m  Pain Scale: 0-10   Pain Score: 0-No pain   SpO2: SpO2: 93 % O2 Device:SpO2: 93 % O2 Flow Rate: .   IO: Intake/output summary:   Intake/Output Summary (Last 24 hours) at 06/09/2020 1235 Last data filed at 06/09/2020 1025 Gross per 24 hour  Intake 370 ml  Output 150 ml  Net 220 ml    LBM: Last BM Date: 06/08/20 Baseline Weight: Weight: 74.8 kg Most recent weight: Weight: 72.2 kg     Palliative Assessment/Data: PPS 30%   Flowsheet Rows   Flowsheet Row Most Recent Value  Intake Tab   Referral Department Hospitalist  Unit at Time of Referral Med/Surg Unit  Palliative Care Primary Diagnosis Neurology  Palliative Care Type New Palliative care  Reason for referral Clarify Goals of Care  Date first seen by Palliative Care 06/09/20  Clinical Assessment   Palliative Performance Scale Score 30%  Psychosocial & Spiritual Assessment   Palliative Care Outcomes   Patient/Family meeting held? Yes  Who was at the meeting? son Dominica Severin) via telephone  Palliative Care Outcomes Clarified goals of care, Counseled regarding hospice, Provided psychosocial or spiritual support, ACP counseling assistance, Provided end of life care assistance       Time Total: 60 Greater than 50%  of this time was spent counseling and coordinating care related to the above assessment and plan.  Signed by:  Ihor Dow, DNP, FNP-C Palliative Medicine Team  Phone: [phone] Fax: [phone]   Please contact Palliative Medicine Team phone at [phone] for questions and concerns.  For individual provider: See Shea Evans

## 2020-06-09 NOTE — Plan of Care (Signed)
Patient is alert and oriented to self only. Calm and cooperative. V/S stable. Turned every 2 hrs. No complaints of pain. Slept well all night. Fall precautions kept in place.

## 2020-06-09 NOTE — Care Management Important Message (Signed)
Important Message  Patient Details  Name: Keith Hicks MRN: [id number] Date of Birth: 05/17/1924   Medicare Important Message Given:  Yes     [name] 06/09/2020, 1:05 PM

## 2020-06-09 NOTE — Progress Notes (Signed)
Occupational Therapy Treatment Patient Details Name: Keith Hicks MRN: [id number] DOB: 1924/05/05 Today's Date: 06/09/2020    History of present illness Pt is a 87 y.o. male with medical history significant for dementia, Parkinson's disease, hypertension, who is admitted with hyperkalemia after presenting from ALF to [facility] ED for evaluation of altered mental status.  MD assessment includes: Hyperkalemia, hypothermia, hypotension, leukopenia, AKI, anemia, pressure ulcer to L lateral malleolus and acute metabolic encephalopathy.   OT comments  Pt seen for PT/OT co-treat this date to f/u re: safety with ADLs/ADL mobility. OT/PT engage pt in sup to sit with MOD A +2 and pt requires MIN A to sustain static sitting balance (initailly able to sustain w/o external support with only the use of UEs, but gradually fatigues and assumes L lateral lean and requires increased assistance). PT and OT initially try standing with patient with arm in arm method for which he requires MOD A +2 from elevated surface including cues from PT for foot placement and cues from OT for safe hand placement. Pt demos decreased standing balance with hips moderately flexed requiring tactile and verbal cues to attempt to extend to full stand. Pt tolerates 4 trials all together in which trial 3 and 4 were completed to engage pt in peri care d/t having episode of bowel incontinence. Ultimately pt requires TOTAL A with OT/PT providing MAX A +2 bilaterally to sustain stand as pt starting to fatigue and CNA ultimately having to assist with peri care as pt cannot meaningfully contribute. Pt returned to bed with MAX A +2 and repositioned. All needs met and in reach. CNA present to replace condom catheter on patient. Will continue to follow. Continue to anticipate pt will require extensive rehabilitation with potential LTC needs should he continue to require assist of more than one person for all self care and mobility.    Follow Up  Recommendations  SNF    Equipment Recommendations  Other (comment) (defer)    Recommendations for Other Services      Precautions / Restrictions Precautions Precautions: Fall Restrictions Weight Bearing Restrictions: No Other Position/Activity Restrictions: Prevalon boots ordered       Mobility Bed Mobility Overal bed mobility: Needs Assistance Bed Mobility: Supine to Sit;Sit to Supine     Supine to sit: Mod assist;+2 for physical assistance;HOB elevated Sit to supine: Max assist;Total assist;+2 for physical assistance   General bed mobility comments: increased time, MOD multimodal cues to sequence    Transfers Overall transfer level: Needs assistance Equipment used: Rolling walker (2 wheeled) Transfers: Sit to/from Stand Sit to Stand: Max assist;+2 physical assistance;From elevated surface         General transfer comment: Pt seated EOB, arm in arm stand attempted first with pt requiring ~MOD A +2, Pt requires RW and MOD A +2 on seoncd stand trial as well and tolerates for ~10 seconds, but is noted to keep hips moderately flexed requiring tactile/verbal cues to attempt to extend to full stand. Subsequent standing trials 3 and 4 completed out of necessity to get pt clean for skin protection and pt fatiguing requirng MAX A +2 from elevated EOB surface on subsequent trials.    Balance Overall balance assessment: Needs assistance Sitting-balance support: Bilateral upper extremity supported;Feet supported Sitting balance-Leahy Scale: Poor Sitting balance - Comments: L lateral lean, initailly F balance with UE support, but gradually fatigues requiring at least MIN A to sustain static sitting balance. Postural control: Left lateral lean   Standing balance-Leahy Scale: Zero Standing balance  comment: MAX A +2 to CTS as well as maintain static stand with UE support.                           ADL either performed or assessed with clinical judgement   ADL Overall  ADL's : Needs assistance/impaired                 Upper Body Dressing : Moderate assistance;Sitting Upper Body Dressing Details (indicate cue type and reason): cues to sequence and OT orients garment Lower Body Dressing: Maximal assistance;Total assistance;Bed level Lower Body Dressing Details (indicate cue type and reason): OT encourages pt to make meaningful effort to bend knee while in high-folwer's position in bed to contribute to donning socks. He does meningfully engage in attemting the ROM necessary to complete this task, but is ultimately unsuccessful.   Toilet Transfer Details (indicate cue type and reason): attempted commoce transfer initially, but pt with poor motor planning for transferring making sit to stand require MAX A +2 and therefore unsuccessful with pivoting. Toileting- Clothing Manipulation and Hygiene: Total assistance;Sit to/from stand Toileting - Clothing Manipulation Details (indicate cue type and reason): Pt completes 4 sit to stand with PT/OT assisting, on thirs stand, it's discovered that pt has had soft/mushy BM in bed. Attempted to have pt meaningfully engage in cleaning himself to no avail ultimately requireing PT/OT to help pt sustain stand and CNA presents to help with posterior peri care.             Vision   Additional Comments: pt with improved tracking and visual attention this date, but still only moderate and requires cues to attend pretty consistently throughout session. Pt is still noted to have relatively dry/irritated eyes requiring cues to blink.   Perception     Praxis      Cognition Arousal/Alertness: Awake/alert Behavior During Therapy: Flat affect Overall Cognitive Status: No family/caregiver present to determine baseline cognitive functioning                                 General Comments: Pt with some improved engagement with PT/OT this session including ansering ~20-25% of questions somewhat appropriately and  demonstrating ~30-40% visual attention throughout session. Pt able to follow simple one step commands ~40-50% of the time and makes meaningful efforts to participate this date and is generally more aware. Pt still only oriented to self. Presents with decreased ability to sequence and motor plan ADLs and fxl transfers.        Exercises     Shoulder Instructions       General Comments      Pertinent Vitals/ Pain       Pain Assessment: Faces Faces Pain Scale: No hurt  Home Living                                          Prior Functioning/Environment              Frequency  Min 1X/week        Progress Toward Goals  OT Goals(current goals can now be found in the care plan section)  Progress towards OT goals: Progressing toward goals  Acute Rehab OT Goals Patient Stated Goal: none stated OT Goal Formulation: Patient unable to participate in goal setting Time  For Goal Achievement: 06/21/20 Potential to Achieve Goals: [place] Discharge plan remains appropriate    Co-evaluation    PT/OT/SLP Co-Evaluation/Treatment: Yes Reason for Co-Treatment: Complexity of the patient's impairments (multi-system involvement);Necessary to address cognition/behavior during functional activity;To address functional/ADL transfers;For patient/therapist safety PT goals addressed during session: Mobility/safety with mobility;Balance;Strengthening/ROM OT goals addressed during session: ADL's and self-care;Proper use of Adaptive equipment and DME      AM-PAC OT "6 Clicks" Daily Activity     Outcome Measure   Help from another person eating meals?: A Lot Help from another person taking care of personal grooming?: A Lot Help from another person toileting, which includes using toliet, bedpan, or urinal?: A Lot Help from another person bathing (including washing, rinsing, drying)?: A Lot Help from another person to put on and taking off regular upper body clothing?: A  Lot Help from another person to put on and taking off regular lower body clothing?: Total 6 Click Score: 11    End of Session Equipment Utilized During Treatment: Gait belt;Rolling walker  OT Visit Diagnosis: Muscle weakness (generalized) (M62.81);Other abnormalities of gait and mobility (R26.89);Other symptoms and signs involving cognitive function   Activity Tolerance Patient limited by fatigue;Patient limited by lethargy   Patient Left in bed;with call bell/phone within reach;with bed alarm set;with nursing/sitter in room (CNA present to re-apply condom catheter)   Nurse Communication Mobility status        Time: 3358-2518 OT Time Calculation (min): 34 min  Charges: OT General Charges $OT Visit: 1 Visit OT Treatments $Self Care/Home Management : 8-22 mins $Therapeutic Activity: 8-22 mins  Gerrianne Scale, [place], OTR/L ascom [phone] 06/09/20, 5:33 PM

## 2020-06-10 DIAGNOSIS — E875 Hyperkalemia: Secondary | ICD-10-CM | POA: Diagnosis not present

## 2020-06-10 LAB — BASIC METABOLIC PANEL
Anion gap: 6 (ref 5–15)
BUN: 22 mg/dL (ref 8–23)
CO2: 23 mmol/L (ref 22–32)
Calcium: 9 mg/dL (ref 8.9–10.3)
Chloride: 111 mmol/L (ref 98–111)
Creatinine, Ser: 0.8 mg/dL (ref 0.61–1.24)
GFR, Estimated: >60 mL/min (ref >60)
Glucose, Bld: 85 mg/dL (ref 70–99)
Potassium: 3.7 mmol/L (ref 3.5–5.1)
Sodium: 140 mmol/L (ref 135–145)

## 2020-06-10 LAB — CBC WITH DIFFERENTIAL/PLATELET
Abs Immature Granulocytes: 0.02 10*3/uL (ref 0.00–0.07)
Basophils Absolute: 0 10*3/uL (ref 0.0–0.1)
Basophils Relative: 0 %
Eosinophils Absolute: 0.2 10*3/uL (ref 0.0–0.5)
Eosinophils Relative: 4 %
HCT: 31.3 % — ABNORMAL LOW (ref 39.0–52.0)
Hemoglobin: 10.2 g/dL — ABNORMAL LOW (ref 13.0–17.0)
Immature Granulocytes: 0 %
Lymphocytes Relative: 19 %
Lymphs Abs: 0.9 10*3/uL (ref 0.7–4.0)
MCH: 28.1 pg (ref 26.0–34.0)
MCHC: 32.6 g/dL (ref 30.0–36.0)
MCV: 86.2 fL (ref 80.0–100.0)
Monocytes Absolute: 0.5 10*3/uL (ref 0.1–1.0)
Monocytes Relative: 11 %
Neutro Abs: 3 10*3/uL (ref 1.7–7.7)
Neutrophils Relative %: 66 %
Platelets: 149 10*3/uL — ABNORMAL LOW (ref 150–400)
RBC: 3.63 MIL/uL — ABNORMAL LOW (ref 4.22–5.81)
RDW: 16.8 % — ABNORMAL HIGH (ref 11.5–15.5)
WBC: 4.6 10*3/uL (ref 4.0–10.5)
nRBC: 0 % (ref 0.0–0.2)

## 2020-06-10 LAB — PHOSPHORUS: Phosphorus: 2.7 mg/dL (ref 2.5–4.6)

## 2020-06-10 LAB — MAGNESIUM: Magnesium: 1.9 mg/dL (ref 1.7–2.4)

## 2020-06-10 LAB — CULTURE, BLOOD (ROUTINE X 2)
Culture: NO GROWTH
Culture: NO GROWTH
Special Requests: ADEQUATE
Special Requests: ADEQUATE

## 2020-06-10 NOTE — Progress Notes (Addendum)
SLP Cancellation Note  Patient Details Name: Keith Hicks MRN: [id number] DOB: 1924-06-27   Cancelled treatment:       Reason Eval/Treat Not Completed:  (chart reviewed). Per NSG report and chart notes, reviewed Palliative Care NSG note w/ Son yesterday indicating pt's overall decline in status in recent weeks, POC: Son wants to initiate Hospice services at discharge; main goal is for pt's comfort; hope to d/c back to [facility]. Pt currently remains on Dysphagia 1, Nectar consistency diet due to risk for silent aspiration secondary to Baseline Parkinson's Dis., Parkinson's Dementia, and suspected chronic aspiration per History and Son's report. Pt has been tolerating the current Dysphagia diet consistency w/ no overt s/s of aspiration reported by NSG, when he has accepted po's.  Recommend continue w/ current Dysphagia diet consistency and Pills Crushed in Puree at D/C, aspiration precautions and feeding support monitoring his State and alertness during all oral intake. Stop feeding if overt s/s of aspiration noted, and alert MD. F/u of services at next venue of care can be had if improvement in pt's Medical and State status' are noted indicating readiness. MD updated.      Jerilynn Som, MS, CCC-SLP Speech Language Pathologist Rehab Services [phone] [facility] 06/10/2020, 11:17 AM

## 2020-06-10 NOTE — Progress Notes (Signed)
[facility] Progress Note  Patient: Keith Hicks    XTK:[id number]  DOA: 06/02/2020     Date of Service: the patient was seen and examined on 06/10/2020  Chief Complaint  Patient presents with  . Failure To Thrive   Brief hospital course: From admission hpi Keith Hicks a 87 y.o.malewith medical history significant fordementia, Parkinson's disease, hypertension,who is admitted to [facility] on 3/3/2022with hyperkalemiaafter [facility] for evaluation of altered mental status.  In the context of the patient's altered mental status, the following historywas obtained via my discussions withthe emergency department physician as well as via chart review.  In the setting of baseline dementia, the staff at [facility] feel that the patient has been more confused relative to this baseline over the course of the last 2 days. They reportedly have not detected significant somnolence over that time, but rather believe that patient is exhibiting worsening confusion over that timeframe.This is been associated with decline in oral intake over the last 2 to 3 days, in the absence of any reported vomiting or diarrhea. No evidence of objective fever at SNF.No reported recent trauma. No evidence of respiratory distress or cough.  Patient's medical history is notable for history of essential hypertension, for which he is on benazepril as a sole outpatient antihypertensive agent. In the setting of Parkinson's disease, he is also on levodopa/carbidopa. Does not appear to be on any testing as an outpatient.   Assessment and Plan: Principal Problem:   Hyperkalemia Active Problems:   AKI (acute kidney injury) (HCC)   Dehydration   Acute metabolic encephalopathy   Hypertension   Parkinson's disease (HCC)  #Hyperkalemia, resolved Acute, was upper limit of normal last month. Suspect 2/2 dehydration (report of decreased po) and acei use. 6.6  on presentation. Received calcium, bicarb, and several rounds of insulin w/ dextrose. Nephrology consulted 3/6 for persistent hyperkalemia. K now wnl. Think most likely 2/2 acei - holding veltassa - will d/c fluids - renin, aldosterone still pending till 3/11 12:42 noon - d/c telemetry - will f/u additional nephrology recs when available - holding home acei, topical nsaid - Pt/ot consulted, rec snf vs 24/7 supervision (currently in assisted living),  discussed with case manager.   # Hypothermia, resolved # Hypotension resolved, now HTN # Leukopenia, resolved Infectious w/u negative. Given empiric vanc/zosyn which were d/c'd  - monitor  # Aspiration risk SLP following, says review of previous SLP notes shows patient has a hx of silent aspiration - monitor - aspiration precautions  # AKI Resolved with fluids  # Acute metabolic encephalopathy Baseline parkinsons dementia. ua and cxr not suggestive of infection. CT head w/o acute findings. Hyperkalemia likely contributory, no other electrolyte abnormalities. covid neg. Mental status is declining patient is deconditioning not able to go back to ALF, appropriate for SNF under hospice care. -Continue supportive care and fall precautions   #Essential hypertension -Discontinued benazepril due to hyperkalemia and AKI  3/9 Started amlodipine 10 mg p.o. daily    # Parkinson's - re-started sinimet and   # Subclinical hypothyroidism - defer starting tx given age  # Normocytic anemia # Iron deficiency anemia Iron deficient, unremarkable smear and b12/folate. No report of hematochezia or melena. Hgb stable - started oral iron - monitor hgb  # Thrombocytopenia Mild, stable. Hiv, hcv, smear unremarkable - monitor  # Pressure ulcer right heal Bandaged, wound nurse following   Body mass index is 23.6 kg/m.    Overall prognosis is  very poor, patient's mental status is declining due to underlying Parkinson disease and  advanced age.  Patient is DNR, palliative care consulted to discuss goals of care with family, and family accepted for hospice care.  Patient will have bed available on Monday as per case manager    Pressure Injury 06/04/20 Ankle Anterior;Left (Active)  06/04/20 1055  Location: Ankle  Location Orientation: Anterior;Left  Staging:   Wound Description (Comments):   Present on Admission: Yes     Diet: Dysphagia 1 nectar thick liquids DVT Prophylaxis: Subcutaneous Lovenox   Advance goals of care discussion: DNR  Family Communication: family was NOT present at bedside, at the time of interview.  Patient has underlying dementia unable to offer any complaints and communicate well enough.  Palliative care consulted to discuss with family regarding goals of care, possible hospice.  Disposition:  Pt is from ALF, admitted with hyperkalemia encephalopathy and history of Parkinson's, dementia, still has Parkinson, which precludes a safe discharge to ALF Discharge to SNF, patient needs 24/7 supervision, high risk for fall and readmission.  Family accepted hospice care, facility will accept patient on Monday as per case management.    Subjective: No overnight issues, patient denies any complaints, laying comfortably.  Hard of hearing Patient was able to take p.o. medications with RN  Physical Exam: General:  Awake, NAD.  Appear in no distress, affect appropriate Eyes: PERRLA ENT: Oral Mucosa Clear, moist  Neck: no JVD,  Cardiovascular: S1 and S2 Present, no Murmur,  Respiratory: good respiratory effort, Bilateral Air entry equal and Decreased, no Crackles, no wheezes Abdomen: Bowel Sound present, Soft and no tenderness,  Skin: no rashes Extremities: no Pedal edema, no calf tenderness Neurologic: without any new focal findings, Parkinson disease, tremors and rigidity Gait not checked due to patient safety concerns  Vitals:   06/09/20 2330 06/10/20 0453 06/10/20 0839 06/10/20 1113  BP: (!)  143/56 (!) 145/52 (!) 135/50 (!) 123/48  Pulse: 63 66 67 69  Resp: 17 18 16 12   Temp: 97.9 F (36.6 C) 97.8 F (36.6 C) 97.9 F (36.6 C) (!) 97.4 F (36.3 C)  TempSrc: Oral Oral  Oral  SpO2: 94% 93% 95% 95%  Weight:      Height:        Intake/Output Summary (Last 24 hours) at 06/10/2020 1231 Last data filed at 06/10/2020 1101 Gross per 24 hour  Intake 240 ml  Output 700 ml  Net -460 ml   Filed Weights   06/07/20 0324 06/08/20 0627 06/09/20 0431  Weight: 76 kg 74.6 kg 72.2 kg    Data Reviewed: I have personally reviewed and interpreted daily labs, tele strips, imagings as discussed above. I reviewed all nursing notes, pharmacy notes, vitals, pertinent old records I have discussed plan of care as described above with RN and patient/family.  CBC: Recent Labs  Lab 06/06/20 0425 06/07/20 0420 06/08/20 0506 06/09/20 0558 06/10/20 0456  WBC 5.3 6.2 6.1 5.2 4.6  NEUTROABS 3.5 4.9 4.7 3.9 3.0  HGB 8.9* 9.7* 9.6* 9.3* 10.2*  HCT 28.8* 29.9* 29.1* 28.7* 31.3*  MCV 88.9 86.7 85.3 86.7 86.2  PLT 121* 119* 129* 144* 149*   Basic Metabolic Panel: Recent Labs  Lab 06/06/20 0425 06/07/20 0420 06/08/20 0506 06/09/20 0558 06/10/20 0456  NA 140 139 140 143 140  K 5.1 4.4 3.9 3.7 3.7  CL 116* 114* 113* 112* 111  CO2 21* 20* 21* 24 23  GLUCOSE 85 114* 88 76 85  BUN 27* 21  17 23 22   CREATININE 1.00 0.86 0.68 0.77 0.80  CALCIUM 8.9 9.1 9.2 8.9 9.0  MG  --   --  1.7 1.8 1.9  PHOS  --   --  2.7 3.4 2.7    Studies: No results found.  Scheduled Meds: . amLODipine  5 mg Oral Daily  . carbidopa-levodopa  1 tablet Oral TID AC  . Chlorhexidine Gluconate Cloth  6 each Topical Q0600  . enoxaparin (LOVENOX) injection  40 mg Subcutaneous Q24H  . ferrous sulfate  325 mg Oral Q48H  . mupirocin ointment  1 application Nasal BID   Continuous Infusions: PRN Meds:   Time spent: 35 minutes  Author: . MD [facility] 06/10/2020 12:31 PM  To reach On-call, see  care teams to locate the attending and reach out to them via [email or web address].08/10/2020. If 7PM-7AM, please contact night-coverage If you still have difficulty reaching the attending provider, please page the [facility] (Director on Call) for [facility] on amion for assistance.

## 2020-06-10 NOTE — Progress Notes (Addendum)
[facility] Room 220 [employer], contracting [facility]) Hospital Liaison RN note:  Received request from Bevelyn Ngo, [facility] for hospice services at [facility] after discharge. Chart and patient information under review by [facility] physician. Hospice eligibility was approved.  Attempt to call son, Jillyn Hidden to initiate education related to hospice philosophy, services and to answer any questions. Voice message left. Per discussion with TOC, plan is to discharge Monday to [facility].  Please send signed and completed DNR home with patient. Please provide prescriptions at discharge as needed to ensure ongoing symptom management.  Please call with any hospice related questions or concerns.  Thank you for the opportunity to participate in this patient's care.  Cyndra Numbers, RN [facility] Liaison [phone]

## 2020-06-10 NOTE — TOC Progression Note (Signed)
Transition of Care [facility]) - Progression Note    Patient Details  Name: Keith Hicks MRN: [id number] Date of Birth: 07/09/1924  Transition of Care [facility]) CM/SW Contact  Chapman Fitch, RN Phone Number: 06/10/2020, 2:34 PM  Clinical Narrative:     Bed offers presented to Son He accepted [facility] and rehab Accepted in [place] and notified Ricky Hawfields.  Hawfields will reach out to son today to arrange private pay.  Per Clide Cliff at [place] admission would have to be Monday so paper work and payment could be completed.  MD notified  Will need repeat covid test  Son does not have a preference of hospice agency.   Hawfields prefers [employer], contracting  Referral made to [place] with Civil engineer, contracting   Expected Discharge Plan:  (TBD) Barriers to Discharge: Continued Medical Work up  Expected Discharge Plan and Services Expected Discharge Plan:  (TBD)     Post Acute Care Choice:  (TBD) Living arrangements for the past 2 months: Assisted Living Facility                                       Social Determinants of Health (SDOH) Interventions    Readmission Risk Interventions No flowsheet data found.

## 2020-06-11 DIAGNOSIS — E875 Hyperkalemia: Secondary | ICD-10-CM | POA: Diagnosis not present

## 2020-06-11 LAB — ALDOSTERONE + RENIN ACTIVITY W/ RATIO
ALDO / PRA Ratio: <1.3 (ref 0.0–30.0)
Aldosterone: <1 ng/dL (ref 0.0–30.0)
PRA LC/MS/MS: 0.783 ng/mL/hr (ref 0.167–5.380)

## 2020-06-11 NOTE — Progress Notes (Signed)
   06/10/20 2316  Assess: MEWS Score  Temp (!) 94.8 F (34.9 C)  BP (!) 147/55  Pulse Rate (!) 56  Resp 18  SpO2 94 %  O2 Device Room Air  Assess: MEWS Score  MEWS Temp 2  MEWS Systolic 0  MEWS Pulse 0  MEWS RR 0  MEWS LOC 0  MEWS Score 2  MEWS Score Color Yellow  Assess: if the MEWS score is Yellow or Red  Were vital signs taken at a resting state? Yes  Focused Assessment No change from prior assessment  Early Detection of Sepsis Score *See Row Information* Medium  MEWS guidelines implemented *See Row Information* Yes  Treat  MEWS Interventions Escalated (See documentation below)  Take Vital Signs  Increase Vital Sign Frequency  Yellow: Q 2hr X 2 then Q 4hr X 2, if remains yellow, continue Q 4hrs  Escalate  MEWS: Escalate Yellow: discuss with charge nurse/RN and consider discussing with provider and RRT  Notify: Charge Nurse/RN  Name of Charge Nurse/RN Notified Delaney Meigs RN   Date Charge Nurse/RN Notified 06/10/20  Time Charge Nurse/RN Notified 2332  Document  Patient Outcome Stabilized after interventions

## 2020-06-11 NOTE — Progress Notes (Signed)
PROGRESS NOTE    Keith Hicks  ZDG:[id number] DOB: [birth year] DOA: 06/02/2020 PCP: Almetta Lovely, [facility]   Chief Complain: Altered mental status  Brief Narrative: Patient is a 87 year old male with history of dementia, Parkinson's disease, hypertensionwho was admitted for the evaluation of altered mental status.  He was found to be severely hyperkalemic on presentation.  He had prolonged hospital course.  Palliative  care was consulted regarding very advanced age, multiple comorbidities, dementia.  Current plan is to discharge him to nursing facility with hospice .  Social worker following.  Patient is medically stable for discharge  Assessment & Plan:   Principal Problem:   Hyperkalemia Active Problems:   Acute renal failure (HCC)   Dehydration   Acute metabolic encephalopathy   Hypertension   Parkinson's disease (HCC)   Palliative care by specialist   Goals of care, counseling/discussion   Hyperkalemia: Suspected to be from ACE inhibitor use, dehydration.  Potassium was 6.6 on presentation.  He was treated with bicarb, calcium, insulin with dextrose.  Nephrology was also following.  Lisinopril is on hold.  K is currently  normal.  Acute metabolic encephalopathy/dementia: Patient has dementia at baseline, Parkinson's disease.  CT head did not show any acute intracranial abnormalities.  Declining mental status.  Patient is ALF resident.  Continue supportive care, delirium precautions  Hypothermia/hypotension/leukopenia: Resolved.  He was also given empiric antibiotics for suspicion of sepsis which has been ruled out  Aspiration recs: SLP following.  Continue dysphagia 1 diet  AKI: Resolved  Hypertension: Blood pressure stable.  Continue amlodipine 10 mg daily.  Benazepril discontinued due to hyperkalemia/AKI  Subclinical hypothyroidism: Treatment not started due to advanced age, hospice approach  Normocytic anemia/thrombocytopenia: Currently stable  Pressure ulcer on  the right heel: Continue supportive care.  Goals of care/disposition: Palliative care was following during this hospitalization.  Family wants hospice approach.  Social worker consulted.  Plan is to discharge him to nursing facility with hospice care.  Likely will happen on Monday             DVT prophylaxis:Lovenox Code Status: DNR Family Communication: None at bedside Status is: Inpatient  Remains inpatient appropriate because:Unsafe d/c plan   Dispo: The patient is from: ALF              Anticipated d/c is to: SNF              Patient currently is medically stable to d/c.   Difficult to place patient No   Consultants: Palliative care  Procedures:None  Antimicrobials:  Anti-infectives (From admission, onward)   Start     Dose/Rate Route Frequency Ordered Stop   06/07/20 0600  vancomycin (VANCOREADY) IVPB 1250 mg/250 mL  Status:  Discontinued        1,250 mg 166.7 mL/hr over 90 Minutes Intravenous Every 24 hours 06/06/20 0847 06/06/20 1001   06/06/20 0600  vancomycin (VANCOREADY) IVPB 1500 mg/300 mL  Status:  Discontinued        1,500 mg 150 mL/hr over 120 Minutes Intravenous Every 24 hours 06/05/20 1156 06/06/20 0847   06/05/20 1400  piperacillin-tazobactam (ZOSYN) IVPB 3.375 g  Status:  Discontinued        3.375 g 12.5 mL/hr over 240 Minutes Intravenous Every 8 hours 06/05/20 1156 06/06/20 1001   06/05/20 1400  vancomycin (VANCOREADY) IVPB 2000 mg/400 mL        2,000 mg 200 mL/hr over 120 Minutes Intravenous  Once 06/05/20 1156 06/05/20 1801  Subjective: Patient seen and examined at the bedside this morning.  Hemodynamically stable.  Lying on the bed.  Very deconditioned, chronically ill looking, weak.  Denies any specific complaints  Objective: Vitals:   06/11/20 0113 06/11/20 0314 06/11/20 0500 06/11/20 0900  BP: (!) 147/57 (!) 113/49  (!) 127/59  Pulse: 61 67  67  Resp: 20 16  16   Temp: (!) 95.2 F (35.1 C) (!) 97.5 F (36.4 C)  98.2 F (36.8 C)   TempSrc: Rectal Axillary  Axillary  SpO2: 95% 95%  93%  Weight:   73.7 kg   Height:        Intake/Output Summary (Last 24 hours) at 06/11/2020 08/11/2020 Last data filed at 06/11/2020 08/11/2020 Gross per 24 hour  Intake 460 ml  Output 1200 ml  Net -740 ml   Filed Weights   06/08/20 0627 06/09/20 0431 06/11/20 0500  Weight: 74.6 kg 72.2 kg 73.7 kg    Examination:  General exam: Elderly, very deconditioned, debilitated HEENT: Ear/Nose normal on gross exam, left subconjunctival hemorrhage Respiratory system:  no wheezes or crackles  Cardiovascular system: S1 & S2 heard, RRR. No pedal edema. Gastrointestinal system: Abdomen is nondistended, soft and nontender. Normal bowel sounds heard. Central nervous system: Alert and but not oriented.  Extremities: No edema, no clubbing ,no cyanosis Skin: Scattered senile purpura    Data Reviewed: I have personally reviewed following labs and imaging studies  CBC: Recent Labs  Lab 06/06/20 0425 06/07/20 0420 06/08/20 0506 06/09/20 0558 06/10/20 0456  WBC 5.3 6.2 6.1 5.2 4.6  NEUTROABS 3.5 4.9 4.7 3.9 3.0  HGB 8.9* 9.7* 9.6* 9.3* 10.2*  HCT 28.8* 29.9* 29.1* 28.7* 31.3*  MCV 88.9 86.7 85.3 86.7 86.2  PLT 121* 119* 129* 144* 149*   Basic Metabolic Panel: Recent Labs  Lab 06/06/20 0425 06/07/20 0420 06/08/20 0506 06/09/20 0558 06/10/20 0456  NA 140 139 140 143 140  K 5.1 4.4 3.9 3.7 3.7  CL 116* 114* 113* 112* 111  CO2 21* 20* 21* 24 23  GLUCOSE 85 114* 88 76 85  BUN 27* 21 17 23 22   CREATININE 1.00 0.86 0.68 0.77 0.80  CALCIUM 8.9 9.1 9.2 8.9 9.0  MG  --   --  1.7 1.8 1.9  PHOS  --   --  2.7 3.4 2.7   GFR: Estimated Creatinine Clearance: 59.6 mL/min (by C-G formula based on SCr of 0.8 mg/dL). Liver Function Tests: No results for input(s): AST, ALT, ALKPHOS, BILITOT, PROT, ALBUMIN in the last 168 hours. Recent Labs  Lab 06/05/20 1114  LIPASE 38   No results for input(s): AMMONIA in the last 168 hours. Coagulation  Profile: No results for input(s): INR, PROTIME in the last 168 hours. Cardiac Enzymes: Recent Labs  Lab 06/05/20 1114  CKTOTAL 42*   BNP (last 3 results) No results for input(s): PROBNP in the last 8760 hours. HbA1C: No results for input(s): HGBA1C in the last 72 hours. CBG: Recent Labs  Lab 06/04/20 1941  GLUCAP 97   Lipid Profile: No results for input(s): CHOL, HDL, LDLCALC, TRIG, CHOLHDL, LDLDIRECT in the last 72 hours. Thyroid Function Tests: No results for input(s): TSH, T4TOTAL, FREET4, T3FREE, THYROIDAB in the last 72 hours. Anemia Panel: No results for input(s): VITAMINB12, FOLATE, FERRITIN, TIBC, IRON, RETICCTPCT in the last 72 hours. Sepsis Labs: Recent Labs  Lab 06/05/20 1114 06/05/20 1501  LATICACIDVEN 1.5 1.4    Recent Results (from the past 240 hour(s))  SARS CORONAVIRUS 2 (TAT 6-24  HRS) Nasopharyngeal Nasopharyngeal Swab     Status: None   Collection Time: 06/02/20  2:45 PM   Specimen: Nasopharyngeal Swab  Result Value Ref Range Status   SARS Coronavirus 2 NEGATIVE NEGATIVE Final    Comment: (NOTE) SARS-CoV-2 target nucleic acids are NOT DETECTED.  The SARS-CoV-2 RNA is generally detectable in upper and lower respiratory specimens during the acute phase of infection. Negative results do not preclude SARS-CoV-2 infection, do not rule out co-infections with other pathogens, and should not be used as the sole basis for treatment or other patient management decisions. Negative results must be combined with clinical observations, patient history, and epidemiological information. The expected result is Negative.  Fact Sheet for Patients: HairSlick.nohttps://www.fda.gov/media/138098/download  Fact Sheet for Healthcare Providers: quierodirigir.comhttps://www.fda.gov/media/138095/download  This test is not yet approved or cleared by the [place] FDA and  has been authorized for detection and/or diagnosis of SARS-CoV-2 by FDA under an Emergency Use Authorization (EUA). This EUA  will remain  in effect (meaning this test can be used) for the duration of the COVID-19 declaration under Se ction 564(b)(1) of the Act, 21 U.S.C. section 360bbb-3(b)(1), unless the authorization is terminated or revoked sooner.  Performed at [facility], [address] [place], [place] [address]   CULTURE, BLOOD (ROUTINE X 2) w Reflex to ID Panel     Status: None   Collection Time: 06/05/20 11:11 AM   Specimen: BLOOD  Result Value Ref Range Status   Specimen Description BLOOD LEFT HAND  Final   Special Requests   Final    BOTTLES DRAWN AEROBIC AND ANAEROBIC Blood Culture adequate volume   Culture   Final    NO GROWTH 5 DAYS Performed at [facility], [address] [place], [place] [address]    Report Status 06/10/2020 FINAL  Final  CULTURE, BLOOD (ROUTINE X 2) w Reflex to ID Panel     Status: None   Collection Time: 06/05/20 11:11 AM   Specimen: BLOOD  Result Value Ref Range Status   Specimen Description BLOOD RIGHT HAND  Final   Special Requests   Final    BOTTLES DRAWN AEROBIC AND ANAEROBIC Blood Culture adequate volume   Culture   Final    NO GROWTH 5 DAYS Performed at [facility], [address] [place], [place] [address]    Report Status 06/10/2020 FINAL  Final  Urine Culture     Status: Abnormal   Collection Time: 06/05/20 11:24 AM   Specimen: Urine, Random  Result Value Ref Range Status   Specimen Description   Final    URINE, RANDOM Performed at [facility], [address] [place], [place] [address]    Special Requests   Final    NONE Performed at [facility], [address] [place], [place] [address]    Culture MULTIPLE SPECIES PRESENT, SUGGEST RECOLLECTION (A)  Final   Report Status 06/06/2020 FINAL  Final  MRSA PCR Screening     Status: Abnormal   Collection Time: 06/05/20 11:36 AM   Specimen: Nasopharyngeal  Result Value Ref Range Status   MRSA by PCR POSITIVE (A) NEGATIVE Final    Comment:         The GeneXpert MRSA Assay (FDA approved for NASAL specimens only), is one component of a comprehensive MRSA colonization surveillance program. It is not intended to diagnose MRSA infection nor to guide or monitor treatment for MRSA infections. RESULT CALLED TO, READ BACK BY AND VERIFIED WITH: JULISA  LEA 06/05/20 AT 1325 BY ACR Performed at [facility], [address], [place], [place] [address]          Radiology Studies: No results found.      Scheduled Meds: . amLODipine  5 mg Oral Daily  . carbidopa-levodopa  1 tablet Oral TID AC  . enoxaparin (LOVENOX) injection  40 mg Subcutaneous Q24H  . ferrous sulfate  325 mg Oral Q48H  . mupirocin ointment  1 application Nasal BID   Continuous Infusions:   LOS: 9 days    Time spent: 25 mins.More than 50% of that time was spent in counseling and/or coordination of care.      Burnadette Pop, MD [facility] P3/03/2021, 9:27 AM

## 2020-06-12 DIAGNOSIS — E875 Hyperkalemia: Secondary | ICD-10-CM | POA: Diagnosis not present

## 2020-06-12 LAB — GLUCOSE, CAPILLARY
Glucose-Capillary: 92 mg/dL (ref 70–99)
Glucose-Capillary: 96 mg/dL (ref 70–99)

## 2020-06-12 NOTE — Progress Notes (Signed)
Patient in bed in stable condition. See flow sheet for vital signs. No changes in assessment from prior assessment. Wound care complete. Patient repositioned in bed. Will continue to monitor patient.

## 2020-06-12 NOTE — Progress Notes (Signed)
PROGRESS NOTE    Keith Hicks  TDD:[id number] DOB: 21-Aug-1924 DOA: 06/02/2020 PCP: Almetta Lovely, [facility]   Chief Complain: Altered mental status  Brief Narrative: Patient is a 87 year old male with history of dementia, Parkinson's disease, hypertensionwho was admitted for the evaluation of altered mental status.  He was found to be severely hyperkalemic on presentation.  He had prolonged hospital course.  Palliative  care was consulted regarding very advanced age, multiple comorbidities, dementia.  Current plan is to discharge him to nursing facility with hospice .  Social worker following.  Patient is medically stable for discharge  Assessment & Plan:   Principal Problem:   Hyperkalemia Active Problems:   Acute renal failure (HCC)   Dehydration   Acute metabolic encephalopathy   Hypertension   Parkinson's disease (HCC)   Palliative care by specialist   Goals of care, counseling/discussion   Hyperkalemia: Suspected to be from ACE inhibitor use, dehydration.  Potassium was 6.6 on presentation.  He was treated with bicarb, calcium, insulin with dextrose.  Nephrology was also following.  Lisinopril is on hold.  K is currently  normal.  Acute metabolic encephalopathy/dementia: Patient has dementia at baseline, Parkinson's disease.  CT head did not show any acute intracranial abnormalities.  Declining mental status.  Patient is ALF resident.  Continue supportive care, delirium precautions  Hypothermia/hypotension/leukopenia: Resolved.  He was also given empiric antibiotics for suspicion of sepsis which has been ruled out  Aspiration recs: SLP following.  Continue dysphagia 1 diet  AKI: Resolved  Hypertension: Blood pressure stable.  Continue amlodipine 10 mg daily.  Benazepril discontinued due to hyperkalemia/AKI  Subclinical hypothyroidism: Treatment not started due to advanced age, hospice approach  Normocytic anemia/thrombocytopenia: Currently stable  Pressure ulcer on  the right heel: Continue supportive care.  Goals of care/disposition: Palliative care was following during this hospitalization.  Family wants hospice care.  Social worker consulted.  Plan is to discharge him to nursing facility with hospice care.  Likely will happen on Monday           DVT prophylaxis:Lovenox Code Status: DNR Family Communication: None at bedside Status is: Inpatient  Remains inpatient appropriate because:Unsafe d/c plan   Dispo: The patient is from: ALF              Anticipated d/c is to: SNF with hospice              Patient currently is medically stable to d/c.   Difficult to place patient No   Consultants: Palliative care  Procedures:None  Antimicrobials:  Anti-infectives (From admission, onward)   Start     Dose/Rate Route Frequency Ordered Stop   06/07/20 0600  vancomycin (VANCOREADY) IVPB 1250 mg/250 mL  Status:  Discontinued        1,250 mg 166.7 mL/hr over 90 Minutes Intravenous Every 24 hours 06/06/20 0847 06/06/20 1001   06/06/20 0600  vancomycin (VANCOREADY) IVPB 1500 mg/300 mL  Status:  Discontinued        1,500 mg 150 mL/hr over 120 Minutes Intravenous Every 24 hours 06/05/20 1156 06/06/20 0847   06/05/20 1400  piperacillin-tazobactam (ZOSYN) IVPB 3.375 g  Status:  Discontinued        3.375 g 12.5 mL/hr over 240 Minutes Intravenous Every 8 hours 06/05/20 1156 06/06/20 1001   06/05/20 1400  vancomycin (VANCOREADY) IVPB 2000 mg/400 mL        2,000 mg 200 mL/hr over 120 Minutes Intravenous  Once 06/05/20 1156 06/05/20 1801  Subjective: Patient seen and examined at the bedside this morning.  Hemodynamically stable during my evaluation.  Lying on the bed, in deep sleep.  Opened his eyes  when I called  his name.  Not in any kind of distress.  Very deconditioned, debilitated  Objective: Vitals:   06/11/20 2044 06/11/20 2331 06/12/20 0441 06/12/20 0500  BP: (!) 125/56 (!) 126/57 (!) 123/52   Pulse: 63 68 62   Resp: 14 16 20    Temp:  98.5 F (36.9 C) 99.3 F (37.4 C) 98.1 F (36.7 C)   TempSrc: Axillary Axillary Axillary   SpO2: 95% 94% 94%   Weight:    73.3 kg  Height:        Intake/Output Summary (Last 24 hours) at 06/12/2020 0732 Last data filed at 06/12/2020 0617 Gross per 24 hour  Intake 360 ml  Output 650 ml  Net -290 ml   Filed Weights   06/09/20 0431 06/11/20 0500 06/12/20 0500  Weight: 72.2 kg 73.7 kg 73.3 kg    Examination:  General exam: Extremely deconditioned, weak  HEENT: Subconjunctival hemorrhage on the left eye Respiratory system:  no wheezes or crackles  Cardiovascular system: S1 & S2 heard, RRR.  Gastrointestinal system: Abdomen is nondistended, soft and nontender. Central nervous system: Not alert or oriented Extremities: No edema, no clubbing ,no cyanosis Skin: No rashes, lesions or ulcers,no icterus ,no pallor    Data Reviewed: I have personally reviewed following labs and imaging studies  CBC: Recent Labs  Lab 06/06/20 0425 06/07/20 0420 06/08/20 0506 06/09/20 0558 06/10/20 0456  WBC 5.3 6.2 6.1 5.2 4.6  NEUTROABS 3.5 4.9 4.7 3.9 3.0  HGB 8.9* 9.7* 9.6* 9.3* 10.2*  HCT 28.8* 29.9* 29.1* 28.7* 31.3*  MCV 88.9 86.7 85.3 86.7 86.2  PLT 121* 119* 129* 144* 149*   Basic Metabolic Panel: Recent Labs  Lab 06/06/20 0425 06/07/20 0420 06/08/20 0506 06/09/20 0558 06/10/20 0456  NA 140 139 140 143 140  K 5.1 4.4 3.9 3.7 3.7  CL 116* 114* 113* 112* 111  CO2 21* 20* 21* 24 23  GLUCOSE 85 114* 88 76 85  BUN 27* 21 17 23 22   CREATININE 1.00 0.86 0.68 0.77 0.80  CALCIUM 8.9 9.1 9.2 8.9 9.0  MG  --   --  1.7 1.8 1.9  PHOS  --   --  2.7 3.4 2.7   GFR: Estimated Creatinine Clearance: 59.6 mL/min (by C-G formula based on SCr of 0.8 mg/dL). Liver Function Tests: No results for input(s): AST, ALT, ALKPHOS, BILITOT, PROT, ALBUMIN in the last 168 hours. Recent Labs  Lab 06/05/20 1114  LIPASE 38   No results for input(s): AMMONIA in the last 168 hours. Coagulation  Profile: No results for input(s): INR, PROTIME in the last 168 hours. Cardiac Enzymes: Recent Labs  Lab 06/05/20 1114  CKTOTAL 42*   BNP (last 3 results) No results for input(s): PROBNP in the last 8760 hours. HbA1C: No results for input(s): HGBA1C in the last 72 hours. CBG: No results for input(s): GLUCAP in the last 168 hours. Lipid Profile: No results for input(s): CHOL, HDL, LDLCALC, TRIG, CHOLHDL, LDLDIRECT in the last 72 hours. Thyroid Function Tests: No results for input(s): TSH, T4TOTAL, FREET4, T3FREE, THYROIDAB in the last 72 hours. Anemia Panel: No results for input(s): VITAMINB12, FOLATE, FERRITIN, TIBC, IRON, RETICCTPCT in the last 72 hours. Sepsis Labs: Recent Labs  Lab 06/05/20 1114 06/05/20 1501  LATICACIDVEN 1.5 1.4    Recent Results (from the past  240 hour(s))  SARS CORONAVIRUS 2 (TAT 6-24 HRS) Nasopharyngeal Nasopharyngeal Swab     Status: None   Collection Time: 06/02/20  2:45 PM   Specimen: Nasopharyngeal Swab  Result Value Ref Range Status   SARS Coronavirus 2 NEGATIVE NEGATIVE Final    Comment: (NOTE) SARS-CoV-2 target nucleic acids are NOT DETECTED.  The SARS-CoV-2 RNA is generally detectable in upper and lower respiratory specimens during the acute phase of infection. Negative results do not preclude SARS-CoV-2 infection, do not rule out co-infections with other pathogens, and should not be used as the sole basis for treatment or other patient management decisions. Negative results must be combined with clinical observations, patient history, and epidemiological information. The expected result is Negative.  Fact Sheet for Patients: HairSlick.no  Fact Sheet for Healthcare Providers: quierodirigir.com  This test is not yet approved or cleared by the [place] FDA and  has been authorized for detection and/or diagnosis of SARS-CoV-2 by FDA under an Emergency Use Authorization (EUA).  This EUA will remain  in effect (meaning this test can be used) for the duration of the COVID-19 declaration under Se ction 564(b)(1) of the Act, 21 U.S.C. section 360bbb-3(b)(1), unless the authorization is terminated or revoked sooner.  Performed at [facility], [address] [place], [place] [address]   CULTURE, BLOOD (ROUTINE X 2) w Reflex to ID Panel     Status: None   Collection Time: 06/05/20 11:11 AM   Specimen: BLOOD  Result Value Ref Range Status   Specimen Description BLOOD LEFT HAND  Final   Special Requests   Final    BOTTLES DRAWN AEROBIC AND ANAEROBIC Blood Culture adequate volume   Culture   Final    NO GROWTH 5 DAYS Performed at [facility], [address] [place], [place] [address]    Report Status 06/10/2020 FINAL  Final  CULTURE, BLOOD (ROUTINE X 2) w Reflex to ID Panel     Status: None   Collection Time: 06/05/20 11:11 AM   Specimen: BLOOD  Result Value Ref Range Status   Specimen Description BLOOD RIGHT HAND  Final   Special Requests   Final    BOTTLES DRAWN AEROBIC AND ANAEROBIC Blood Culture adequate volume   Culture   Final    NO GROWTH 5 DAYS Performed at [facility], [address], [place], [place] [address]    Report Status 06/10/2020 FINAL  Final  Urine Culture     Status: Abnormal   Collection Time: 06/05/20 11:24 AM   Specimen: Urine, Random  Result Value Ref Range Status   Specimen Description   Final    URINE, RANDOM Performed at [facility], [address], [place], [place] [address]    Special Requests   Final    NONE Performed at [facility], [address], [place], [place] [address]    Culture MULTIPLE SPECIES PRESENT, SUGGEST RECOLLECTION (A)  Final   Report Status 06/06/2020 FINAL  Final  MRSA PCR Screening     Status: Abnormal   Collection Time: 06/05/20 11:36 AM   Specimen: Nasopharyngeal  Result Value Ref Range Status   MRSA by PCR POSITIVE (A) NEGATIVE Final     Comment:        The GeneXpert MRSA Assay (FDA approved for NASAL specimens only), is one component of a comprehensive MRSA colonization surveillance program. It is not intended to diagnose MRSA infection nor to guide or monitor treatment for MRSA infections. RESULT CALLED  TO, READ BACK BY AND VERIFIED WITH: JULISA LEA 06/05/20 AT 1325 BY ACR Performed at [facility], [address] [place], [place] [id number]          Radiology Studies: No results found.      Scheduled Meds: . amLODipine  5 mg Oral Daily  . carbidopa-levodopa  1 tablet Oral TID AC  . enoxaparin (LOVENOX) injection  40 mg Subcutaneous Q24H  . ferrous sulfate  325 mg Oral Q48H   Continuous Infusions:   LOS: 10 days    Time spent: 25 mins.More than 50% of that time was spent in counseling and/or coordination of care.      Burnadette PopAmrit Gabrielle Wakeland, MD [facility] P3/13/2022, 7:32 AM

## 2020-06-12 NOTE — Progress Notes (Signed)
I personally assessed patient.  I have reviewed and agree with Letitia Neri, SN's assessment data.

## 2020-06-13 DIAGNOSIS — E875 Hyperkalemia: Secondary | ICD-10-CM | POA: Diagnosis not present

## 2020-06-13 LAB — RESP PANEL BY RT-PCR (FLU A&B, COVID) ARPGX2
Influenza A by PCR: NEGATIVE
Influenza B by PCR: NEGATIVE
SARS Coronavirus 2 by RT PCR: NEGATIVE

## 2020-06-13 MED ORDER — FERROUS SULFATE 325 (65 FE) MG PO TABS: 325.0000 mg | ORAL_TABLET | ORAL | 3 refills | Status: AC

## 2020-06-13 NOTE — Discharge Summary (Signed)
Physician Discharge Summary  Keith SalterDalton F Hicks GUY:[id number] DOB: 01/27/1925 DOA: 06/02/2020  PCP: Almetta LovelyHousecalls, Doctors Making  Admit date: 06/02/2020 Discharge date: 06/13/2020  Admitted From: Home Disposition:  SNF  Discharge Condition:Stable CODE STATUS:DNR Diet recommendation:Dysphagia 1 diet  Brief/Interim Summary: Patient is a 87 year old male with history of dementia, Parkinson's disease, hypertensionwho was admitted for the evaluation of altered mental status.  He was found to be severely hyperkalemic on presentation.  He had prolonged hospital course.  Palliative  care was consulted regarding very advanced age, multiple comorbidities, dementia.  Current plan is to discharge him to nursing facility with hospice .    Patient is medically stable for discharge.  Following problems were addressed during his hospitalization:  Hyperkalemia: Suspected to be from ACE inhibitor use, dehydration.  Potassium was 6.6 on presentation.  He was treated with bicarb, calcium, insulin with dextrose.  Nephrology was also following.  Lisinopril is on hold.  K is currently  normal.  Acute metabolic encephalopathy/dementia: Patient has dementia at baseline, Parkinson's disease.  CT head did not show any acute intracranial abnormalities.  Declining mental status.  Continue supportive care, delirium precautions  Hypothermia/hypotension/leukopenia: Resolved.  He was also given empiric antibiotics for suspicion of sepsis which has been ruled out  Aspiration recs: SLP following.  Continue dysphagia 1 diet  AKI: Resolved  Hypertension: Blood pressure stable/soft.   Benazepril discontinued due to hyperkalemia/AKI.  Monitor him without antihypertensives.  Subclinical hypothyroidism: Treatment not started due to advanced age, hospice approach  Normocytic anemia/thrombocytopenia: Currently stable  Pressure ulcer on the right heel: Continue supportive care.  Goals of care/disposition: Palliative care  was following during this hospitalization.  Family wants hospice care.  Social worker consulted.  Plan is to discharge him to nursing facility with hospice care.      Discharge Diagnoses:  Principal Problem:   Hyperkalemia Active Problems:   Acute renal failure (HCC)   Dehydration   Acute metabolic encephalopathy   Hypertension   Parkinson's disease (HCC)   Palliative care by specialist   Goals of care, counseling/discussion    Discharge Instructions  Discharge Instructions    Diet general   Complete by: As directed    Dysphagia 1 diet   Discharge instructions   Complete by: As directed    Do a CBC and BMP tests in  a week   Discharge wound care:   Complete by: As directed    As per wound nurse   Increase activity slowly   Complete by: As directed      Allergies as of 06/13/2020   No Known Allergies     Medication List    STOP taking these medications   benazepril 10 MG tablet Commonly known as: LOTENSIN     TAKE these medications   acetaminophen 650 MG CR tablet Commonly known as: TYLENOL Take 650 mg by mouth 3 (three) times daily.   carbidopa-levodopa 25-100 MG tablet Commonly known as: SINEMET IR Take 1 tablet by mouth 3 (three) times daily before meals.   diclofenac Sodium 1 % Gel Commonly known as: VOLTAREN Apply 4 g topically 2 (two) times daily.   ferrous sulfate 325 (65 FE) MG tablet Take 1 tablet (325 mg total) by mouth every other day. Start taking on: June 14, 2020            Discharge Care Instructions  (From admission, onward)         Start     Ordered   06/13/20 0000  Discharge  wound care:       Comments: As per wound nurse   06/13/20 1024          Contact information for after-discharge care    Destination    [facility] .   Service: Skilled Nursing Contact information: [address] [facility] [place] [address] [phone]                 No Known  Allergies  Consultations:  palliative care   Procedures/Studies: CT Head Wo Contrast  Result Date: 06/02/2020 CLINICAL DATA:  Mental status change EXAM: CT HEAD WITHOUT CONTRAST TECHNIQUE: Contiguous axial images were obtained from the base of the skull through the vertex without intravenous contrast. COMPARISON:  04/19/2020 FINDINGS: Brain: Mild atrophy.  Mild white matter hypodensity bilaterally. Negative for acute infarct, hemorrhage, mass. Vascular: Negative for hyperdense vessel. Skull: Negative Sinuses/Orbits: Paranasal sinuses clear. Mild right mastoid tip effusion. Left mastoid clear. Negative orbit Other: None IMPRESSION: No acute abnormality no change from the prior study. Mild atrophy and mild chronic microvascular ischemic change in the white matter. Electronically Signed   By: Marlan Palau M.D.   On: 06/02/2020 14:15   US RENAL  Result Date: 06/05/2020 CLINICAL DATA:  Hyper potassium.  Acute renal failure. EXAM: RENAL / URINARY TRACT ULTRASOUND COMPLETE COMPARISON:  None. FINDINGS: Right Kidney: Renal measurements: 11.7 x 4.6 x 4.4 cm = volume: 123 mL. Contains a 1.8 cm cyst. No hydronephrosis. Left Kidney: Renal measurements: 9.5 x 5.0 x 4.5 cm = volume: 110 mL. Contains a 1.3 cm cyst. Bladder: Appears normal for degree of bladder distention. Other: Right pleural effusion.  Enlarged prostate with a volume of 93 cc. IMPRESSION: 1. A single small cyst is seen in each kidney.  No hydronephrosis. 2. Right pleural effusion. 3. Enlarged prostate with a volume of 93 cc. Electronically Signed   By: Gerome Sam III M.D   On: 06/05/2020 14:51   DG Chest Port 1 View  Result Date: 06/05/2020 CLINICAL DATA:  Hyperthermia.  Failure to thrive. EXAM: PORTABLE CHEST 1 VIEW COMPARISON:  06/02/2020 FINDINGS: Patient rotated left. Midline trachea. Mild cardiomegaly. Atherosclerosis in the transverse aorta. No pleural effusion or pneumothorax. Pulmonary interstitial prominence and indistinctness,  increased. Left base airspace disease, new or increased. IMPRESSION: Worsened aeration with mild interstitial edema which is increased. Developing left lower lobe airspace disease, most likely atelectasis. Aortic Atherosclerosis (ICD10-I70.0). Electronically Signed   By: Jeronimo Greaves M.D.   On: 06/05/2020 14:33   DG Chest Portable 1 View  Result Date: 06/02/2020 CLINICAL DATA:  Altered mental status EXAM: PORTABLE CHEST 1 VIEW COMPARISON:  04/19/2020 FINDINGS: Stable cardiomediastinal contours. Atherosclerotic calcification of the aortic knob. Previously seen bilateral airspace opacities have resolved. Minimal persistent interstitial prominence. No pleural effusion or pneumothorax. IMPRESSION: Mild bilateral interstitial prominence, which could reflect mild edema. Appearance has improved compared to the previous study. Electronically Signed   By: Duanne Guess D.O.   On: 06/02/2020 14:39       Subjective: Patient seen and examined the bedside this morning.  Hemodynamically stable.  Stable for discharge Discharge Exam: Vitals:   06/13/20 0416 06/13/20 1012  BP: (!) 147/55 (!) 102/42  Pulse: (!) 58 (!) 56  Resp: 16 14  Temp: (!) 97.3 F (36.3 C) (!) 97.5 F (36.4 C)  SpO2: 94% 94%   Vitals:   06/12/20 2030 06/12/20 2318 06/13/20 0416 06/13/20 1012  BP: (!) 128/53 (!) 138/58 (!) 147/55 (!) 102/42  Pulse: Marland Kitchen)  55 66 (!) 58 (!) 56  Resp: 16 18 16 14   Temp: (!) 97.5 F (36.4 C) (!) 97.5 F (36.4 C) (!) 97.3 F (36.3 C) (!) 97.5 F (36.4 C)  TempSrc: Oral Oral Oral Oral  SpO2: 95% 97% 94% 94%  Weight:      Height:        General: Pt is alert, not in acute distress, very deconditioned, debilitated Cardiovascular: RRR, S1/S2 +, no rubs, no gallops Respiratory: CTA bilaterally, no wheezing, no rhonchi Abdominal: Soft, NT, ND, bowel sounds + Extremities: no edema, no cyanosis    The results of significant diagnostics from this hospitalization (including imaging, microbiology,  ancillary and laboratory) are listed below for reference.     Microbiology: Recent Results (from the past 240 hour(s))  CULTURE, BLOOD (ROUTINE X 2) w Reflex to ID Panel     Status: None   Collection Time: 06/05/20 11:11 AM   Specimen: BLOOD  Result Value Ref Range Status   Specimen Description BLOOD LEFT HAND  Final   Special Requests   Final    BOTTLES DRAWN AEROBIC AND ANAEROBIC Blood Culture adequate volume   Culture   Final    NO GROWTH 5 DAYS Performed at [facility], [address] [place], [place]    Report Status 06/10/2020 FINAL  Final  CULTURE, BLOOD (ROUTINE X 2) w Reflex to ID Panel     Status: None   Collection Time: 06/05/20 11:11 AM   Specimen: BLOOD  Result Value Ref Range Status   Specimen Description BLOOD RIGHT HAND  Final   Special Requests   Final    BOTTLES DRAWN AEROBIC AND ANAEROBIC Blood Culture adequate volume   Culture   Final    NO GROWTH 5 DAYS Performed at [facility], [address], [place], [place]    Report Status 06/10/2020 FINAL  Final  Urine Culture     Status: Abnormal   Collection Time: 06/05/20 11:24 AM   Specimen: Urine, Random  Result Value Ref Range Status   Specimen Description   Final    URINE, RANDOM Performed at [facility], [address], [place], [place]    Special Requests   Final    NONE Performed at [facility], [address], [place], [place]    Culture MULTIPLE SPECIES PRESENT, SUGGEST RECOLLECTION (A)  Final   Report Status 06/06/2020 FINAL  Final  MRSA PCR Screening     Status: Abnormal   Collection Time: 06/05/20 11:36 AM   Specimen: Nasopharyngeal  Result Value Ref Range Status   MRSA by PCR POSITIVE (A) NEGATIVE Final    Comment:        The GeneXpert MRSA Assay (FDA approved for NASAL specimens only), is one component of a comprehensive MRSA colonization surveillance program. It is not intended to diagnose  MRSA infection nor to guide or monitor treatment for MRSA infections. RESULT CALLED TO, READ BACK BY AND VERIFIED WITH: JULISA LEA 06/05/20 AT 1325 BY ACR Performed at [facility], [address] [place], [place]      Labs: BNP (last 3 results) Recent Labs    04/19/20 0615  BNP 106.3*   Basic Metabolic Panel: Recent Labs  Lab 06/07/20 0420 06/08/20 0506 06/09/20 0558 06/10/20 0456  NA 139 140 143 140  K 4.4 3.9 3.7 3.7  CL 114* 113* 112* 111  CO2 20* 21* 24 23  GLUCOSE 114* 88 76  85  BUN CREATININE 0.86 0.68 0.77 0.80  CALCIUM 9.1 9.2 8.9 9.0  MG  --  1.7 1.8 1.9  PHOS  --  2.7 3.4 2.7   Liver Function Tests: No results for input(s): AST, ALT, ALKPHOS, BILITOT, PROT, ALBUMIN in the last 168 hours. No results for input(s): LIPASE, AMYLASE in the last 168 hours. No results for input(s): AMMONIA in the last 168 hours. CBC: Recent Labs  Lab 06/07/20 0420 06/08/20 0506 06/09/20 0558 06/10/20 0456  WBC 6.2 6.1 5.2 4.6  NEUTROABS 4.9 4.7 3.9 3.0  HGB 9.7* 9.6* 9.3* 10.2*  HCT 29.9* 29.1* 28.7* 31.3*  MCV 86.7 85.3 86.7 86.2  PLT 119* 129* 144* 149*   Cardiac Enzymes: No results for input(s): CKTOTAL, CKMB, CKMBINDEX, TROPONINI in the last 168 hours. BNP: Invalid input(s): POCBNP CBG: Recent Labs  Lab 06/12/20 0803 06/12/20 1147  GLUCAP 92 96   D-Dimer No results for input(s): DDIMER in the last 72 hours. Hgb A1c No results for input(s): HGBA1C in the last 72 hours. Lipid Profile No results for input(s): CHOL, HDL, LDLCALC, TRIG, CHOLHDL, LDLDIRECT in the last 72 hours. Thyroid function studies No results for input(s): TSH, T4TOTAL, T3FREE, THYROIDAB in the last 72 hours.  Invalid input(s): FREET3 Anemia work up No results for input(s): VITAMINB12, FOLATE, FERRITIN, TIBC, IRON, RETICCTPCT in the last 72 hours. Urinalysis    Component Value Date/Time   COLORURINE YELLOW (A) 06/09/2020 0445   APPEARANCEUR CLEAR (A)  06/09/2020 0445   LABSPEC 1.016 06/09/2020 0445   PHURINE 5.0 06/09/2020 0445   GLUCOSEU NEGATIVE 06/09/2020 0445   HGBUR NEGATIVE 06/09/2020 0445   BILIRUBINUR NEGATIVE 06/09/2020 0445   KETONESUR 5 (A) 06/09/2020 0445   PROTEINUR NEGATIVE 06/09/2020 0445   NITRITE NEGATIVE 06/09/2020 0445   LEUKOCYTESUR NEGATIVE 06/09/2020 0445   Sepsis Labs Invalid input(s): PROCALCITONIN,  WBC,  LACTICIDVEN Microbiology Recent Results (from the past 240 hour(s))  CULTURE, BLOOD (ROUTINE X 2) w Reflex to ID Panel     Status: None   Collection Time: 06/05/20 11:11 AM   Specimen: BLOOD  Result Value Ref Range Status   Specimen Description BLOOD LEFT HAND  Final   Special Requests   Final    BOTTLES DRAWN AEROBIC AND ANAEROBIC Blood Culture adequate volume   Culture   Final    NO GROWTH 5 DAYS Performed at [facility], [address] [place], [place] [address]    Report Status 06/10/2020 FINAL  Final  CULTURE, BLOOD (ROUTINE X 2) w Reflex to ID Panel     Status: None   Collection Time: 06/05/20 11:11 AM   Specimen: BLOOD  Result Value Ref Range Status   Specimen Description BLOOD RIGHT HAND  Final   Special Requests   Final    BOTTLES DRAWN AEROBIC AND ANAEROBIC Blood Culture adequate volume   Culture   Final    NO GROWTH 5 DAYS Performed at [facility], [address] [place], [place] [address]    Report Status 06/10/2020 FINAL  Final  Urine Culture     Status: Abnormal   Collection Time: 06/05/20 11:24 AM   Specimen: Urine, Random  Result Value Ref Range Status   Specimen Description   Final    URINE, RANDOM Performed at [facility], [address], [place], [place] [address]    Special Requests   Final    NONE Performed at [facility], [address] [place],  [place] [address]    Culture MULTIPLE SPECIES PRESENT, SUGGEST RECOLLECTION (A)  Final   Report Status 06/06/2020 FINAL  Final  MRSA PCR Screening     Status: Abnormal    Collection Time: 06/05/20 11:36 AM   Specimen: Nasopharyngeal  Result Value Ref Range Status   MRSA by PCR POSITIVE (A) NEGATIVE Final    Comment:        The GeneXpert MRSA Assay (FDA approved for NASAL specimens only), is one component of a comprehensive MRSA colonization surveillance program. It is not intended to diagnose MRSA infection nor to guide or monitor treatment for MRSA infections. RESULT CALLED TO, READ BACK BY AND VERIFIED WITH: JULISA LEA 06/05/20 AT 1325 BY ACR Performed at [facility], [address], [place], [place] [address]     Please note: You were cared for by a hospitalist during your hospital stay. Once you are discharged, your primary care physician will handle any further medical issues. Please note that NO REFILLS for any discharge medications will be authorized once you are discharged, as it is imperative that you return to your primary care physician (or establish a relationship with a primary care physician if you do not have one) for your post hospital discharge needs so that they can reassess your need for medications and monitor your lab values.    Time coordinating discharge: 40 minutes  SIGNED:   Burnadette Pop, MD  [facility] 06/13/2020, 10:25 AM Pager [phone]  If 7PM-7AM, please contact night-coverage [email or web address] Password [email or web address]

## 2020-06-13 NOTE — Progress Notes (Addendum)
[facility] Room 220 [facility]) Hospital Liaison RN note:  Spoke with son, Jillyn Hidden over the phone to initiate education related to hospice philosophy, services and to answer any questions. He verbalized understanding and had no questions. Per discussion, plan is to discharge to [facility] today. I will fax the discharge summary once it is available.  Please send signed and completed DNR home with patient. Please provide prescriptions as needed to ensure ongoing symptom management.  Please call with any hospice related questions or concerns.  Thank you for the opportunity to participate in this patient's care.  Cyndra Numbers, RN [facility] Liaison [phone]

## 2020-06-13 NOTE — TOC Transition Note (Signed)
Transition of Care [facility]) - CM/SW Discharge Note   Patient Details  Name: Keith Hicks MRN: [id number] Date of Birth: September 30, 1924  Transition of Care [facility]) CM/SW Contact:  Maree Krabbe, LCSW Phone Number: 06/13/2020, 12:01 PM   Clinical Narrative:   Clinical Social Worker facilitated patient discharge including contacting patient family and facility to confirm patient discharge plans.  Clinical information faxed to facility and family agreeable with plan.  CSW arranged ambulance transport via [facility] to [facility]   .  RN to call [phone] for report prior to discharge.      Final next level of care: Skilled Nursing Facility Barriers to Discharge: No Barriers Identified   Patient Goals and CMS Choice        Discharge Placement              Patient chooses bed at:  [name]) Patient to be transferred to facility by: [facility] Name of family member notified: Jillyn Hidden, son Patient and family notified of of transfer: 06/13/20  Discharge Plan and Services     Post Acute Care Choice:  (TBD)                               Social Determinants of Health (SDOH) Interventions     Readmission Risk Interventions No flowsheet data found.

## 2020-06-13 NOTE — Progress Notes (Signed)
Physical Therapy Treatment Patient Details Name: Keith Hicks MRN: [id number] DOB: January 28, 1925 Today's Date: 06/13/2020    History of Present Illness Pt is a 87 y.o. male with medical history significant for dementia, Parkinson's disease, hypertension, who is admitted with hyperkalemia after presenting from ALF to [facility] ED for evaluation of altered mental status.  MD assessment includes: Hyperkalemia, hypothermia, hypotension, leukopenia, AKI, anemia, pressure ulcer to L lateral malleolus and acute metabolic encephalopathy.    PT Comments    Pt was pleasant and put forth good effort during the session.  Pt continued to require physical assist with all functional tasks but significantly less so than during the previous session.  Pt was able to come to standing multiple times this session but was unable to advance either LE and required min A for stability once in standing.  Pt will benefit from PT services in a SNF setting upon discharge to safely address deficits listed in patient problem list for decreased caregiver assistance and eventual return to PLOF.     Follow Up Recommendations  SNF;Supervision/Assistance - 24 hour     Equipment Recommendations  Other (comment) (TBD at next venue of care)    Recommendations for Other Services       Precautions / Restrictions Precautions Precautions: Fall Restrictions Weight Bearing Restrictions: No Other Position/Activity Restrictions: Prevalon boots to BLE's    Mobility  Bed Mobility Overal bed mobility: Needs Assistance Bed Mobility: Supine to Sit;Sit to Supine     Supine to sit: Min assist;Mod assist Sit to supine: Min assist;Mod assist   General bed mobility comments: Increased time, effort, and cues for sequencing    Transfers Overall transfer level: Needs assistance Equipment used: Rolling walker (2 wheeled) Transfers: Sit to/from Stand Sit to Stand: Min assist;Mod assist         General transfer comment: Mod verbal  and tactile cues for sequencing  Ambulation/Gait             General Gait Details: Unable to advance either LE in standing   Stairs             Wheelchair Mobility    Modified Rankin (Stroke Patients Only)       Balance Overall balance assessment: Needs assistance Sitting-balance support: Bilateral upper extremity supported;Feet supported Sitting balance-Leahy Scale: Fair Sitting balance - Comments: Pt able to maintain static sitting position at the EOB without physical assistance     Standing balance-Leahy Scale: Poor Standing balance comment: Min A for static standing stability at the EOB with heavy lean on the RW for support                            Cognition Arousal/Alertness: Awake/alert Behavior During Therapy: Flat affect Overall Cognitive Status: No family/caregiver present to determine baseline cognitive functioning                                        Exercises Total Joint Exercises Ankle Circles/Pumps: AROM;Strengthening;Both;10 reps Short Arc Quad: AROM;AAROM;Both;10 reps;Strengthening Heel Slides: AROM;Strengthening;Both;10 reps;AAROM Hip ABduction/ADduction: AAROM;Strengthening;Both;10 reps Straight Leg Raises: AAROM;Strengthening;Both;10 reps Long Arc Quad: Strengthening;Both;10 reps Knee Flexion: Strengthening;Both;10 reps    General Comments        Pertinent Vitals/Pain Pain Assessment: No/denies pain    Home Living  Prior Function            PT Goals (current goals can now be found in the care plan section) Progress towards PT goals: Progressing toward goals    Frequency    Min 2X/week      PT Plan Current plan remains appropriate    Co-evaluation              AM-PAC PT "6 Clicks" Mobility   Outcome Measure  Help needed turning from your back to your side while in a flat bed without using bedrails?: A Lot Help needed moving from lying on your back  to sitting on the side of a flat bed without using bedrails?: A Lot Help needed moving to and from a bed to a chair (including a wheelchair)?: A Lot Help needed standing up from a chair using your arms (e.g., wheelchair or bedside chair)?: A Lot Help needed to walk in hospital room?: Total Help needed climbing 3-5 steps with a railing? : Total 6 Click Score: 10    End of Session Equipment Utilized During Treatment: Gait belt Activity Tolerance: Patient tolerated treatment well Patient left: in bed;with call bell/phone within reach;with nursing/sitter in room (Pt with nurse and CNA for hygiene at end of session) Nurse Communication: Mobility status PT Visit Diagnosis: Difficulty in walking, not elsewhere classified (R26.2);Muscle weakness (generalized) (M62.81)     Time: 2706-2376 PT Time Calculation (min) (ACUTE ONLY): 23 min  Charges:  $Therapeutic Exercise: 8-22 mins $Therapeutic Activity: 8-22 mins                     D. Scott Raunel Dimartino PT, DPT 06/13/20, 10:48 AM

## 2020-06-13 NOTE — Care Management Important Message (Signed)
Important Message  Patient Details  Name: Keith Hicks MRN: [id number] Date of Birth: 1924/10/18   Medicare Important Message Given:  Yes     [name] 06/13/2020, 11:33 AM

## 2020-06-13 NOTE — Progress Notes (Signed)
Order received from Dr Renford Dills to hold am norvasc due to BP of 102/42, HR 56

## 2020-10-31 DEATH — deceased

## 2022-03-07 IMAGING — CT CT HIP*L* W/O CM
2 of 4 series · 16 of 46 positions shown, 19 images · non-contrast
Comparison: Radiographs 04/19/2020

CLINICAL DATA: Left hip pain.  Unwitnessed fall.

EXAM:
CT OF THE LEFT HIP WITHOUT CONTRAST
TECHNIQUE: Multidetector CT imaging of the left hip was performed according to
the standard protocol. Multiplanar CT image reconstructions were
also generated.

[Series 4: axial st · axial · 0.44mm/px · z∈[-1183,-1015]mm · 13 of 93 slices shown, 16 images]
[im 6/93  soft-tissue]
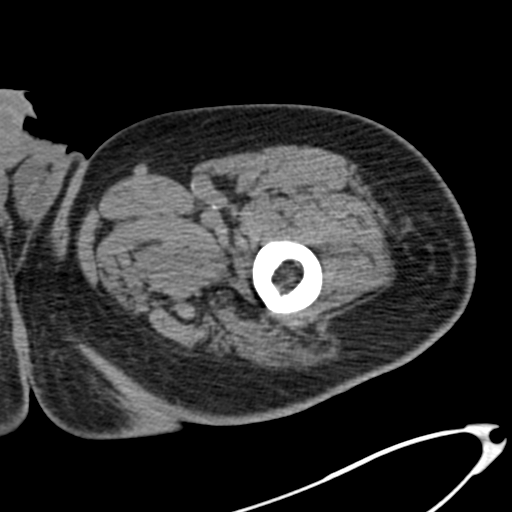
[im 6/93  bone]
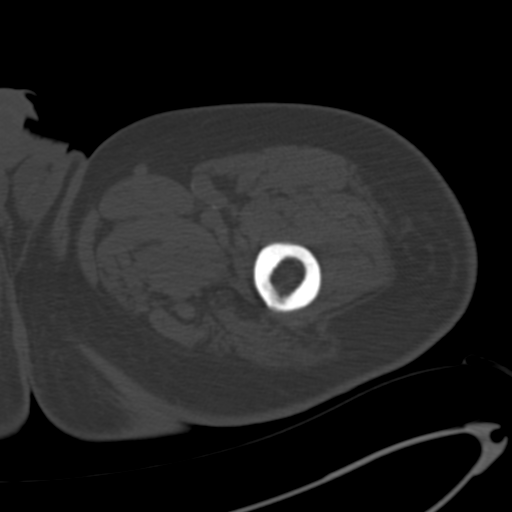
[im 15/93  soft-tissue]
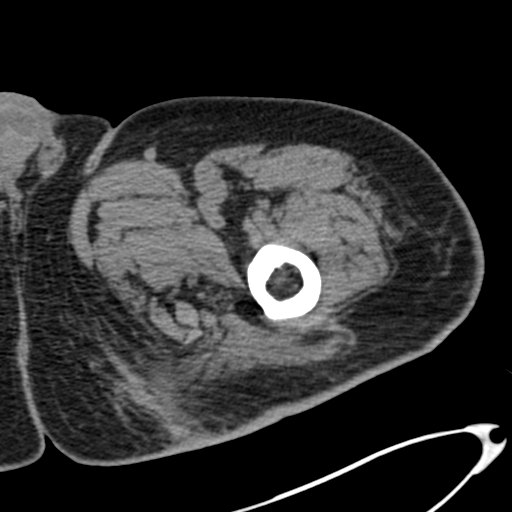
[im 24/93  soft-tissue]
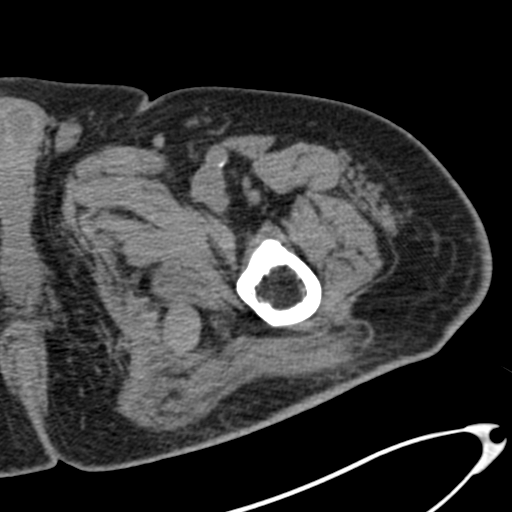
[im 33/93  soft-tissue]
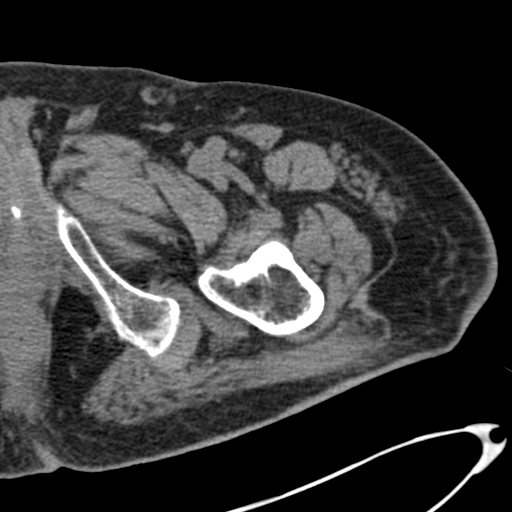
[im 42/93  soft-tissue]
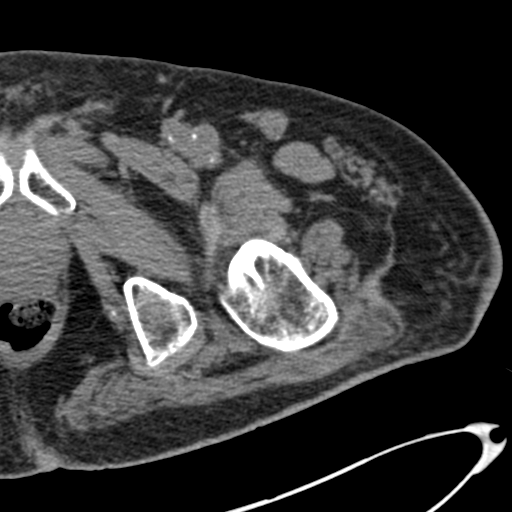
[im 51/93  soft-tissue]
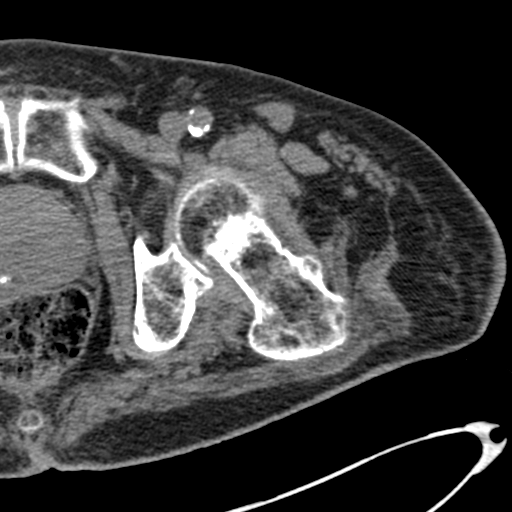
[im 60/93  soft-tissue]
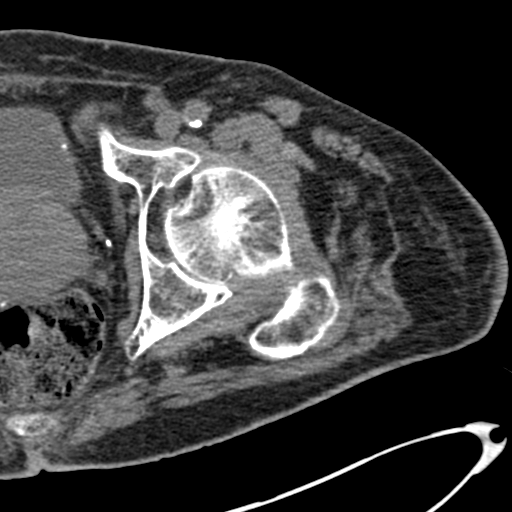
[im 69/93  soft-tissue]
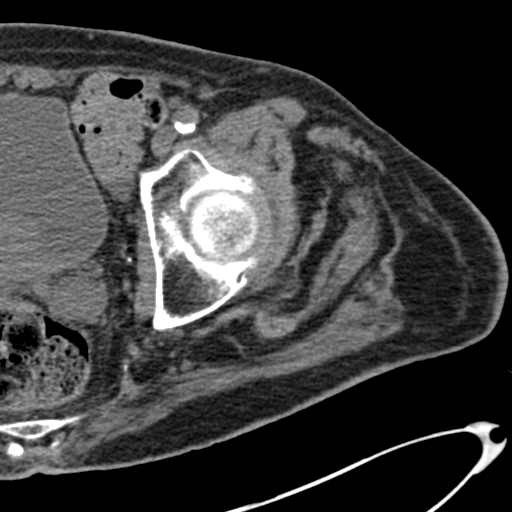
[im 78/93  soft-tissue]
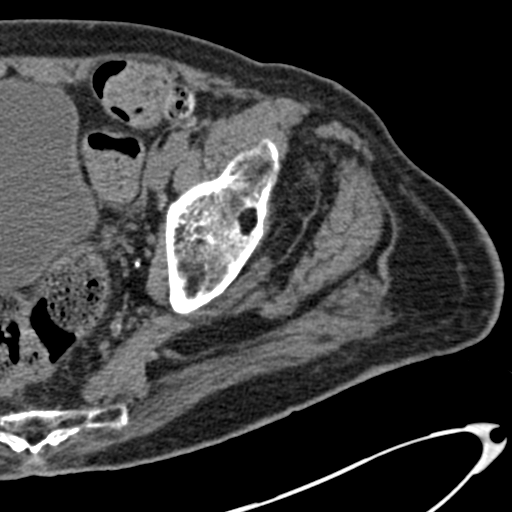
[im 78/93  bone]
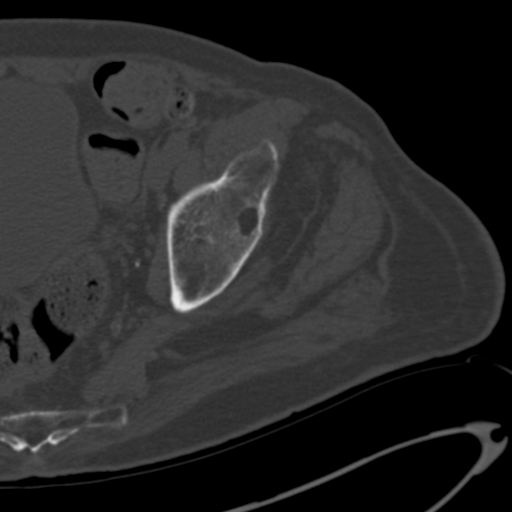
[im 81/93  lung]
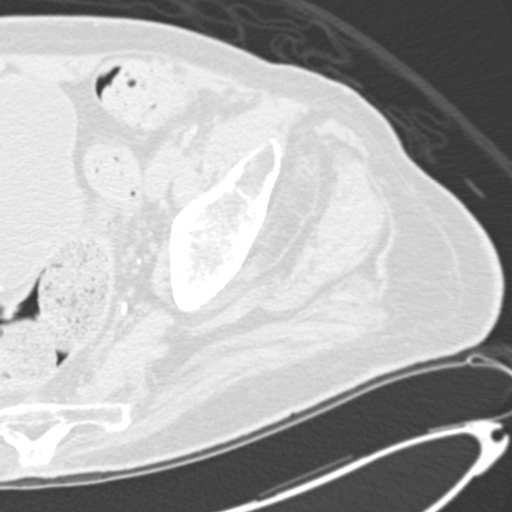
[im 84/93  lung]
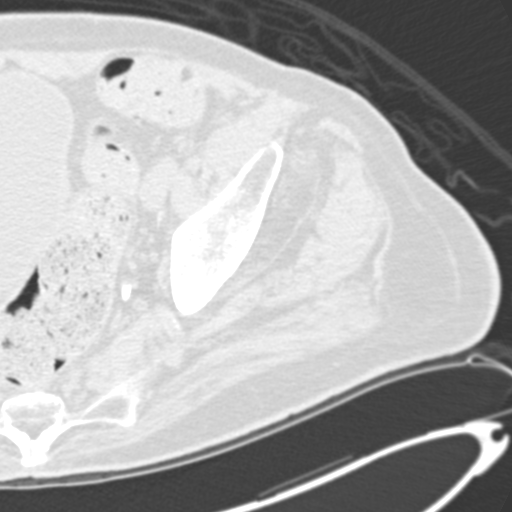
[im 87/93  soft-tissue]
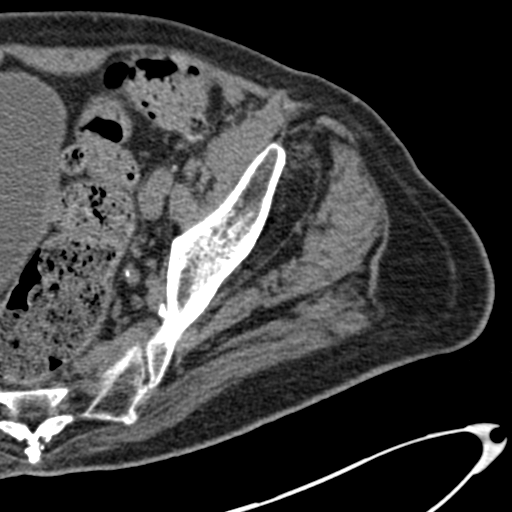
[im 87/93  lung]
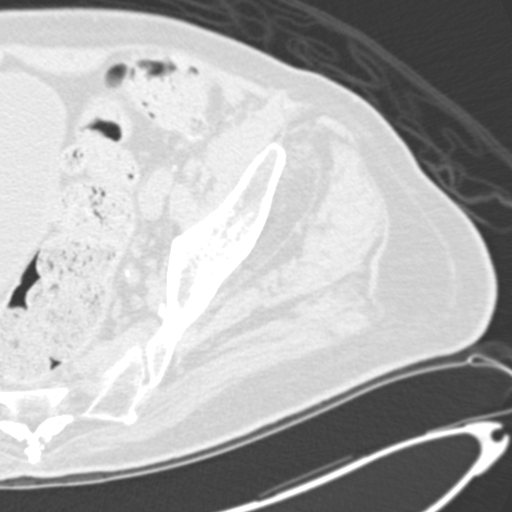
[im 90/93  lung]
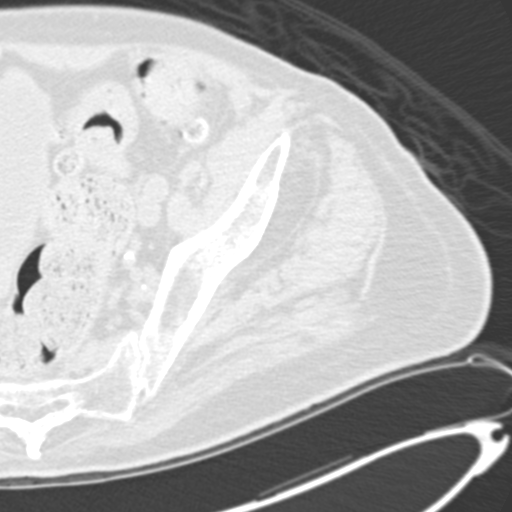

[Series 9: coronal st · coronal · 0.37mm/px · 3 of 106 slices shown]
[im 36/106  soft-tissue]
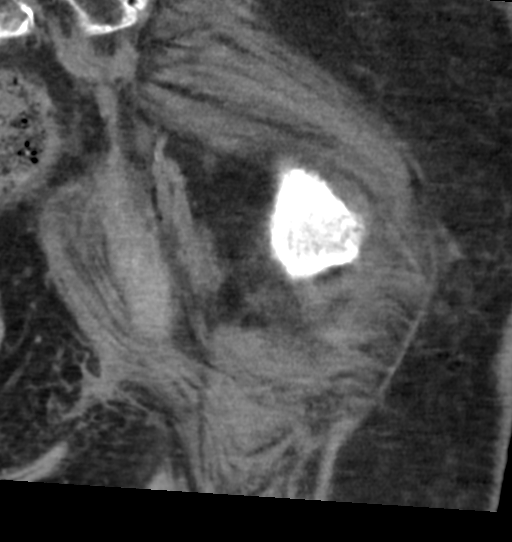
[im 47/106  soft-tissue]
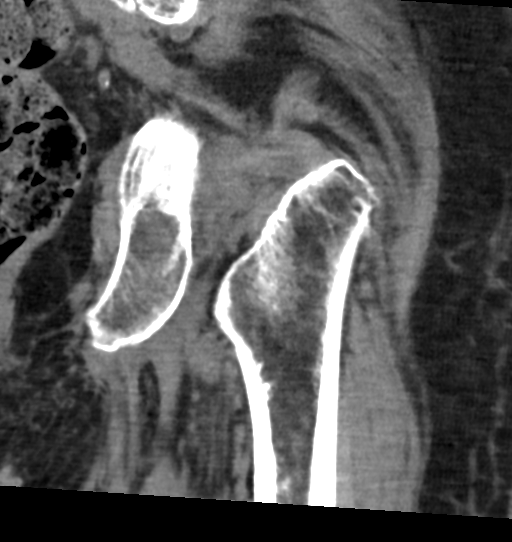
[im 59/106  soft-tissue]
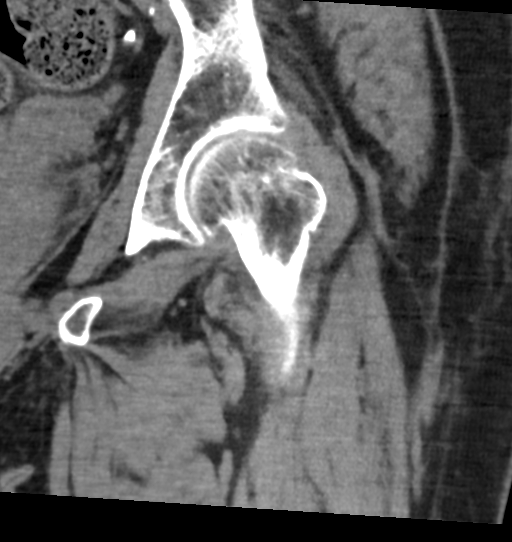

[16 of 46 positions shown; findings below may reference images not displayed]

FINDINGS: Bones/Joint/Cartilage

No well-defined fracture or acute bony abnormality identified.
Degenerative subcortical cysts are present in the upper acetabular
rim. There is spurring of the left femoral head which appears
roughly similar to the 10/02/2017 exam, along with some exaggerated
external rotation of the left hip which likewise appears to be
chronic.

Ligaments

Suboptimally assessed by CT.

Muscles and Tendons

Chronic atrophy of the gluteus minimus muscle, not uncommon in this
age group.

Soft tissues

External iliac, common femoral, and superficial femoral artery
atherosclerotic calcification. Prominence of stool in the distal
sigmoid colon and rectal vault, constipation not excluded. Suspected
prostatomegaly.
IMPRESSION: 1. No well-defined fracture or acute bony abnormality is identified.
Please note that MRI can have higher sensitivity for subtle
fractures and stress injuries.
2. Degenerative subcortical cysts in the upper acetabular rim.
3. Prominence of stool in the distal sigmoid colon and rectal vault,
constipation not excluded.
4. Suspected prostatomegaly.
5. Chronic atrophy of the gluteus minimus muscle, not uncommon in
this age group.
6. External iliac, common femoral, and superficial femoral artery
atherosclerotic calcification.

## 2022-03-07 IMAGING — CR DG HIP (WITH OR WITHOUT PELVIS) 2-3V*L*
1 series · 7 of 7 positions shown · non-contrast
Comparison: Left hip series 10/23/2010.

Pelvis radiograph 10/02/2017.

CLINICAL DATA: [AGE] male status post fall. Hip pain.
Parkinson's.

EXAM:
DG HIP (WITH OR WITHOUT PELVIS) 2-3V LEFT

[Series 1: dg hip unilat w or w/o pelvis 2-3 views  · non-contrast · 0.14mm/px · 7 of 7 slices shown]
[im 1/7]
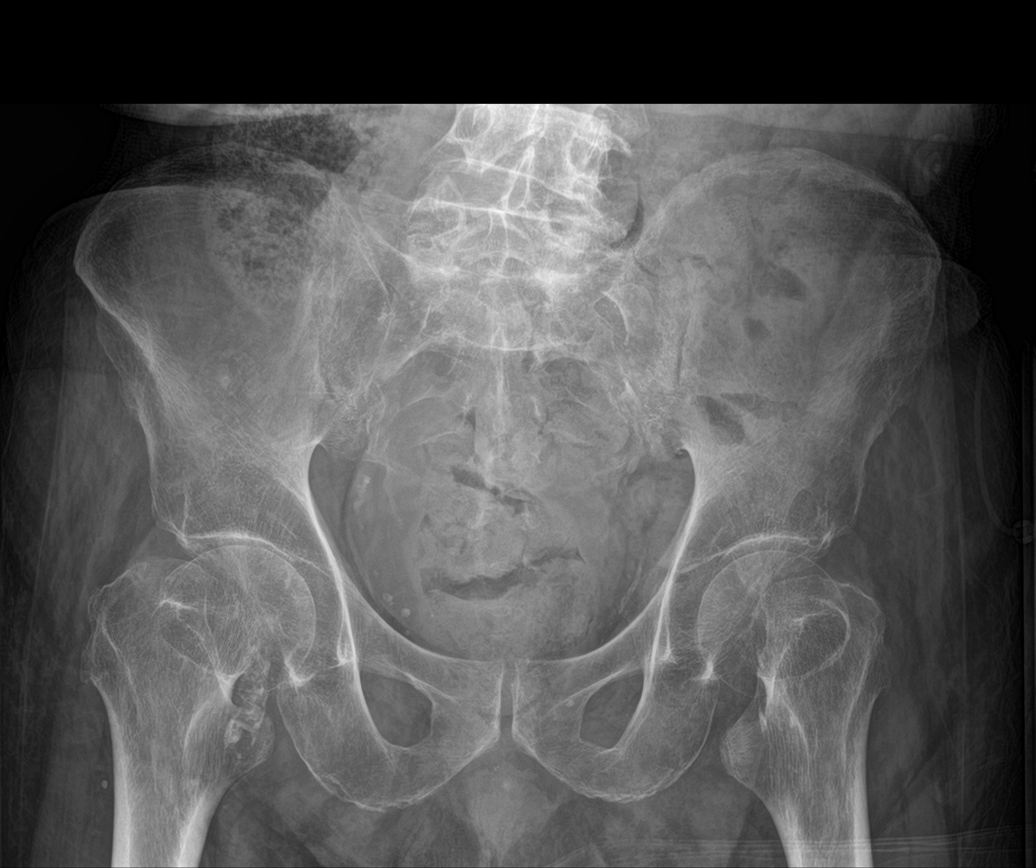
[im 2/7]
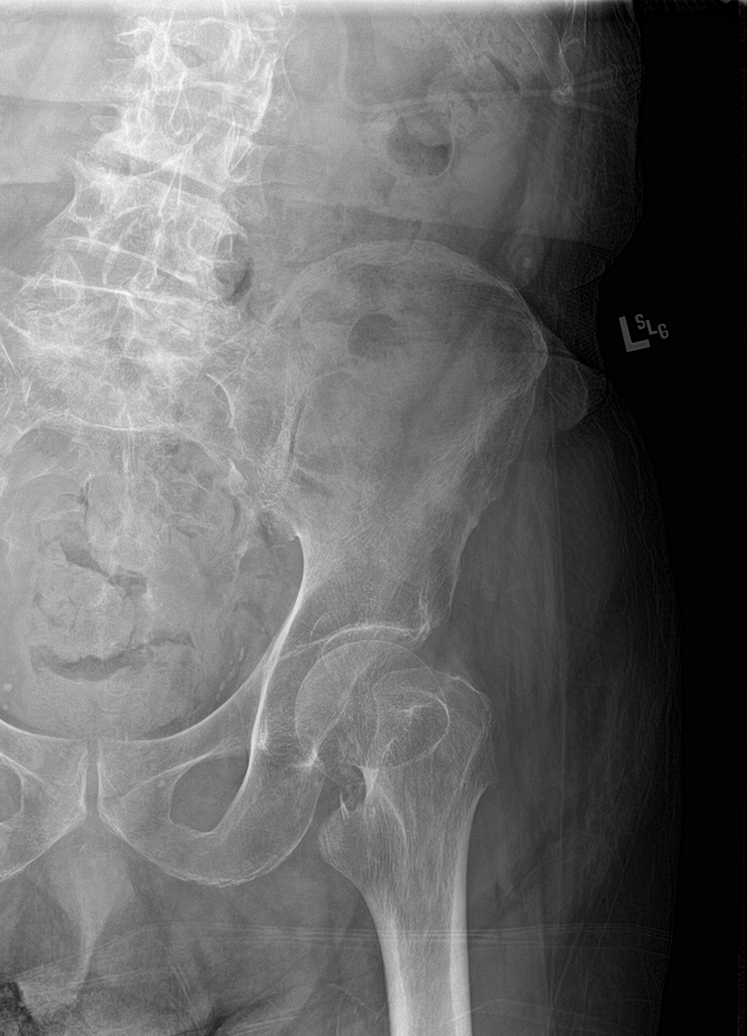
[im 3/7]
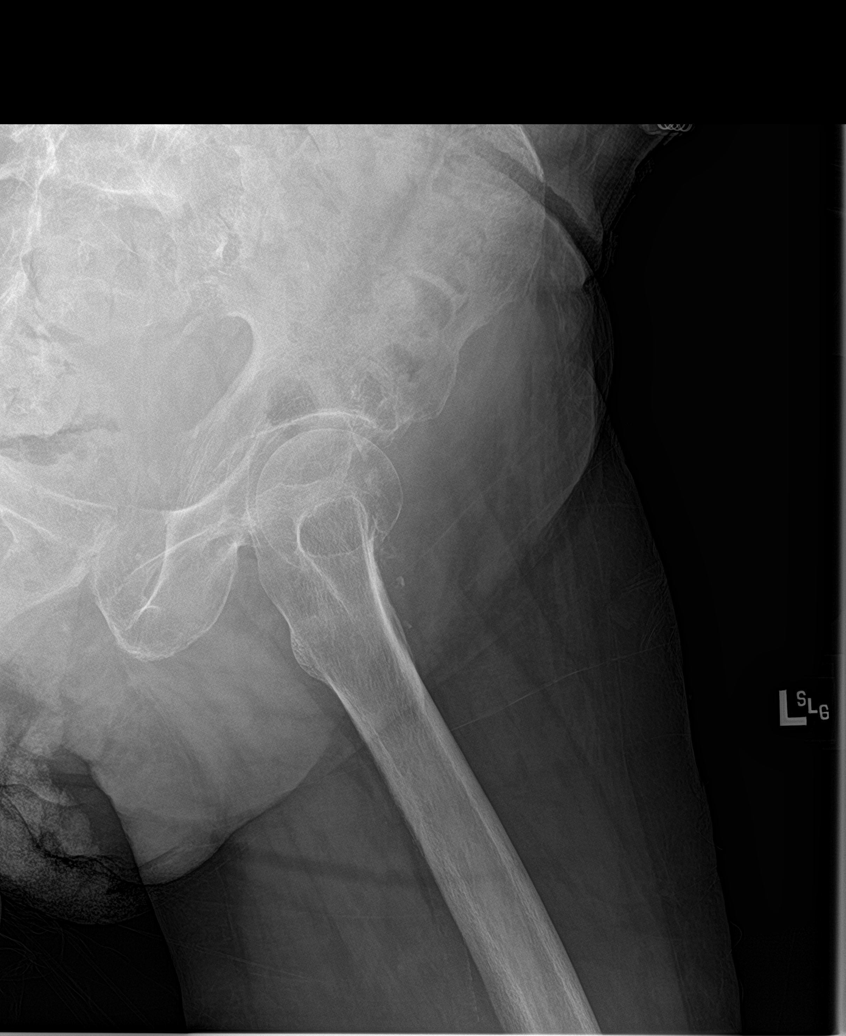
[im 4/7]
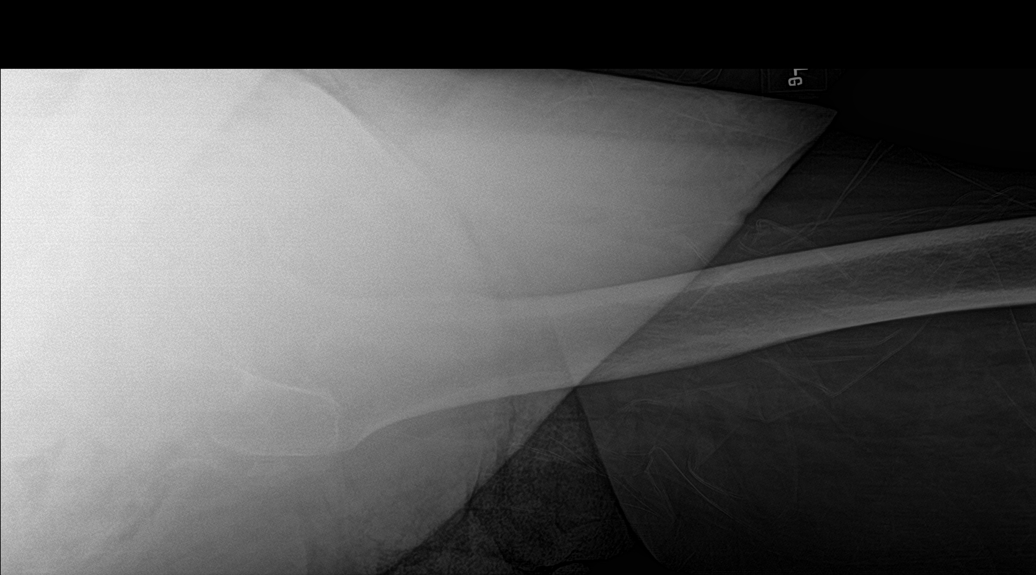
[im 5/7]
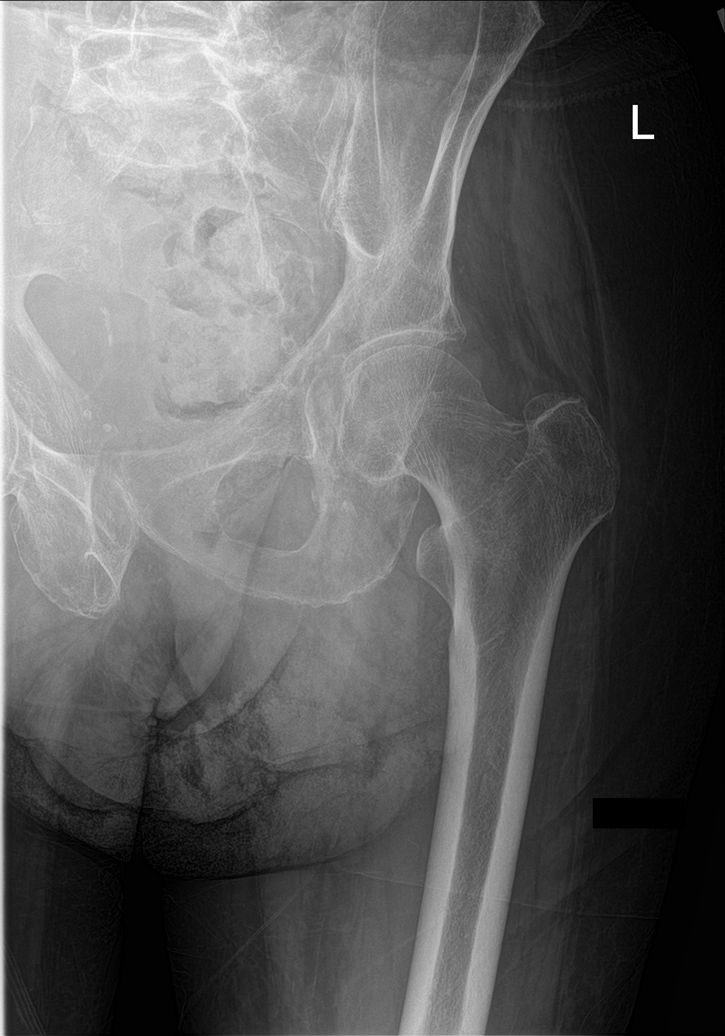
[im 6/7]
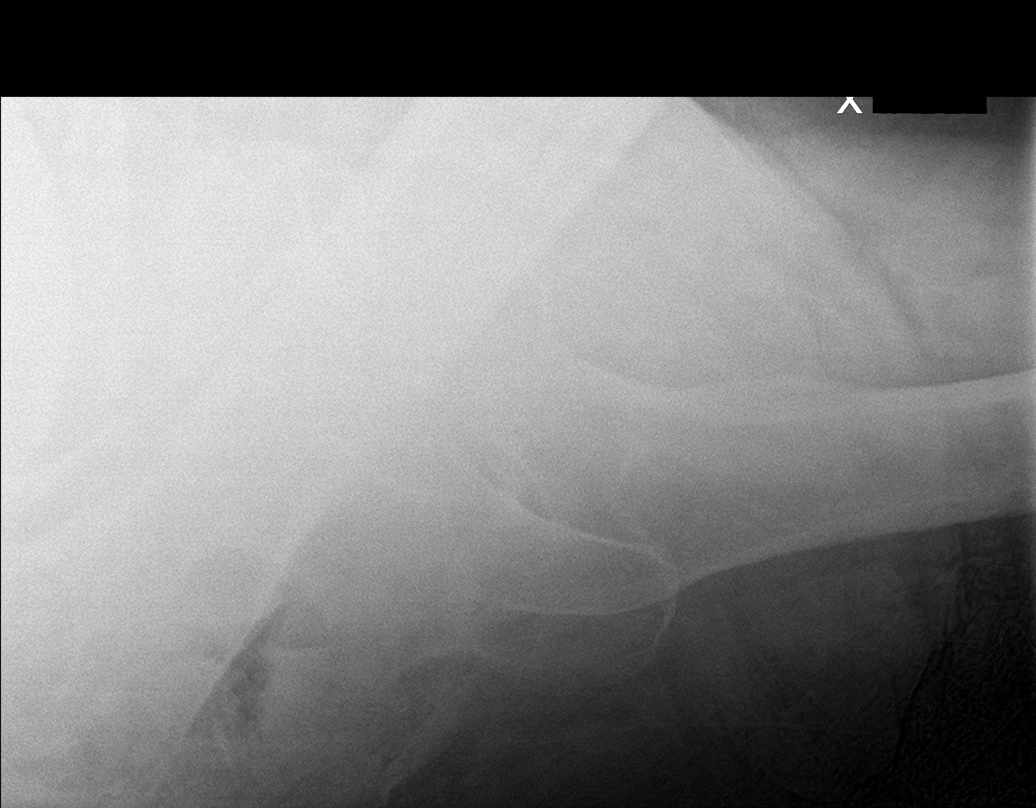
[im 7/7]
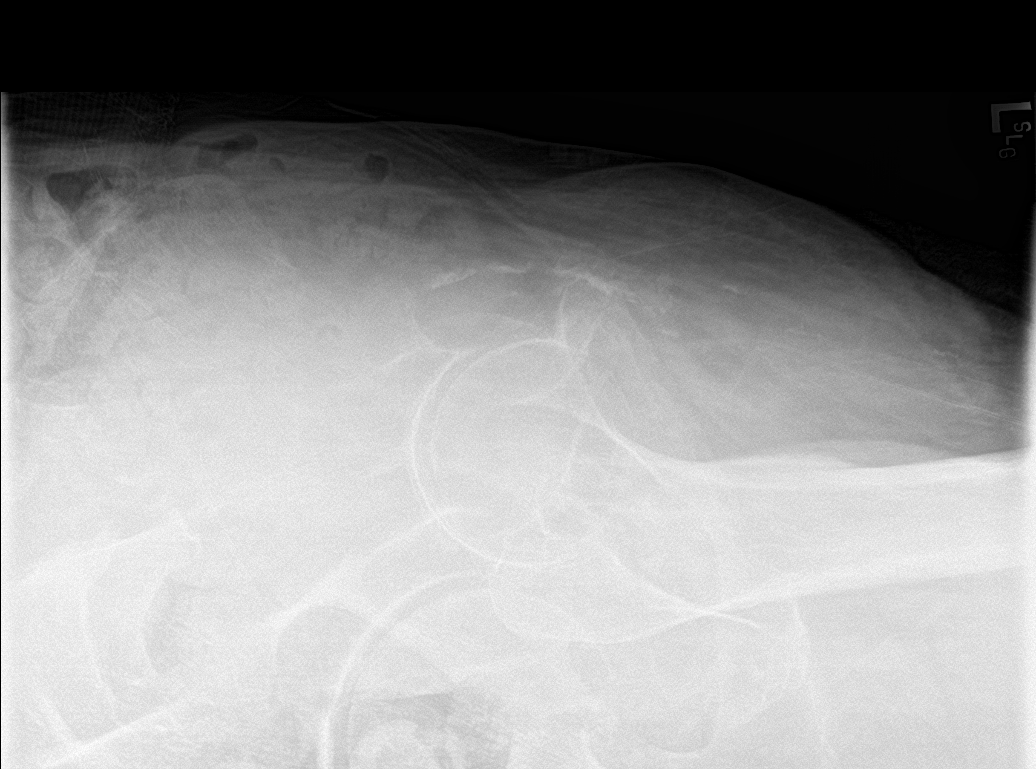

[7 of 7 positions shown; findings below may reference images not displayed]

FINDINGS: Both hips are rotated, similar to but more pronounced than on the
7115 comparison. On the initial images at 6903 hours the degree of
left leg rotation completely obscures the left femoral neck.
Fortunately, repeat images were obtained at 3924 hours, and compared
to the 1921 left hip series the proximal left femur appears stable
and intact.

Pelvis appears stable and intact. Grossly intact proximal right
femur. Vascular calcifications, including calcified right femoral
artery atherosclerosis. Negative visible bowel gas pattern.
Partially visible lumbar spine degeneration and levoconvex
scoliosis.
IMPRESSION: No acute fracture or dislocation identified about the left hip or
pelvis.

## 2022-03-07 IMAGING — CR DG ANKLE COMPLETE 3+V*L*
1 series · 3 of 3 positions shown · non-contrast
Comparison: None.

CLINICAL DATA: Fall.  Left ankle pain.

EXAM:
LEFT ANKLE COMPLETE - 3+ VIEW

[Series 1: dg ankle complete left · 0.14mm/px · 3 of 3 slices shown]
[im 1/3]
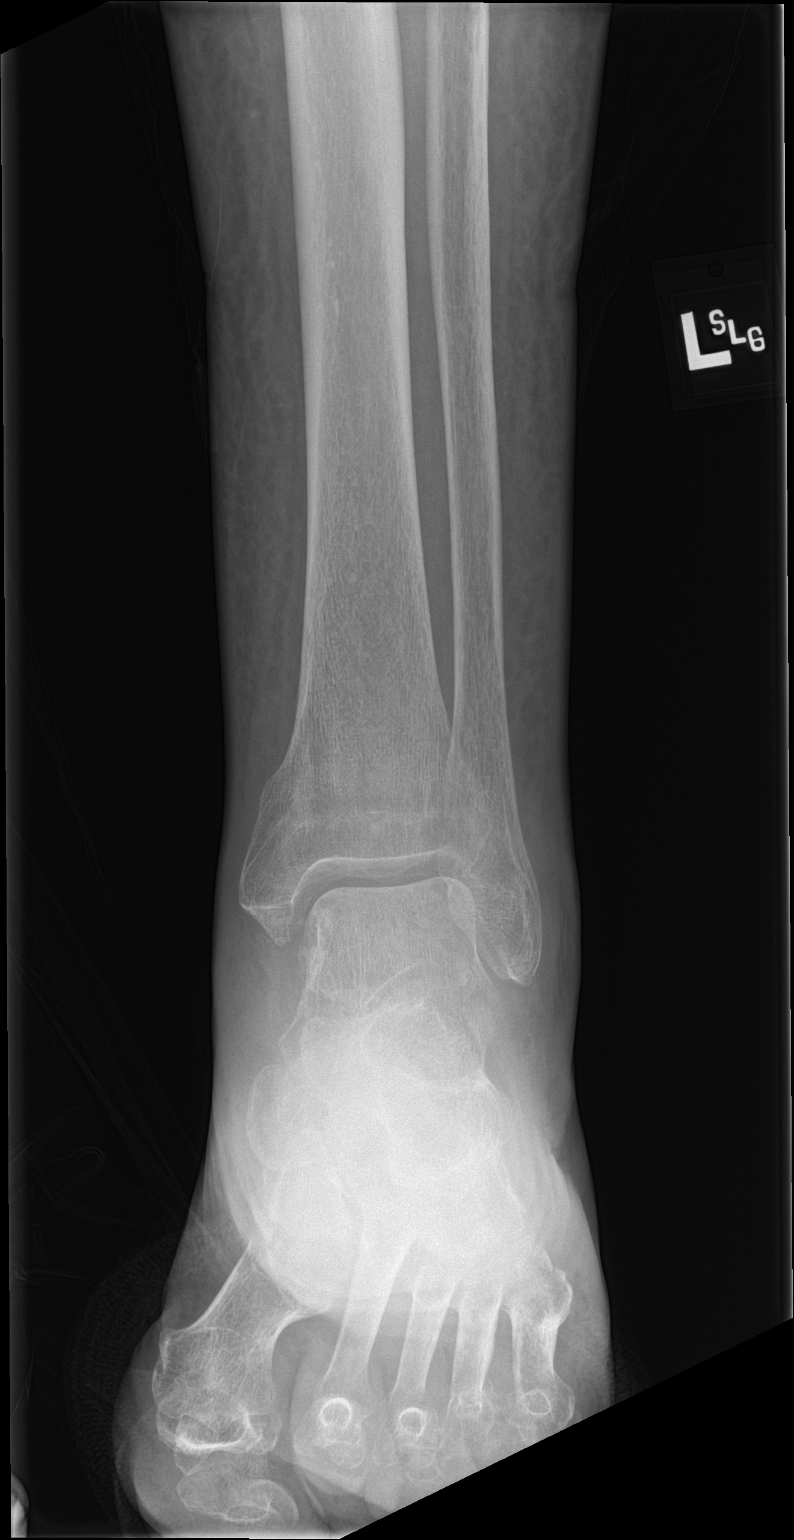
[im 2/3]
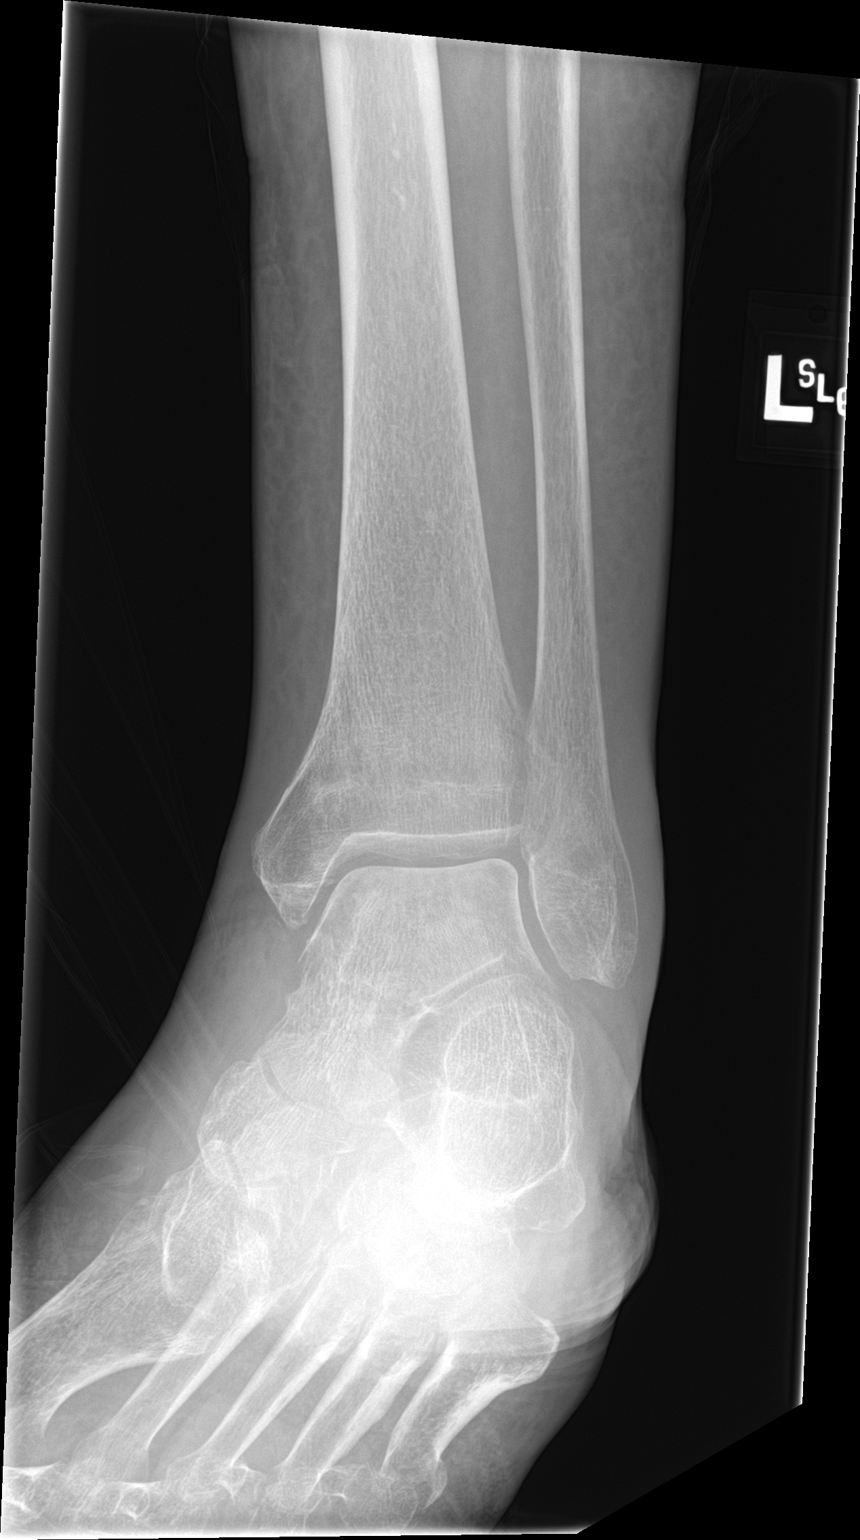
[im 3/3]
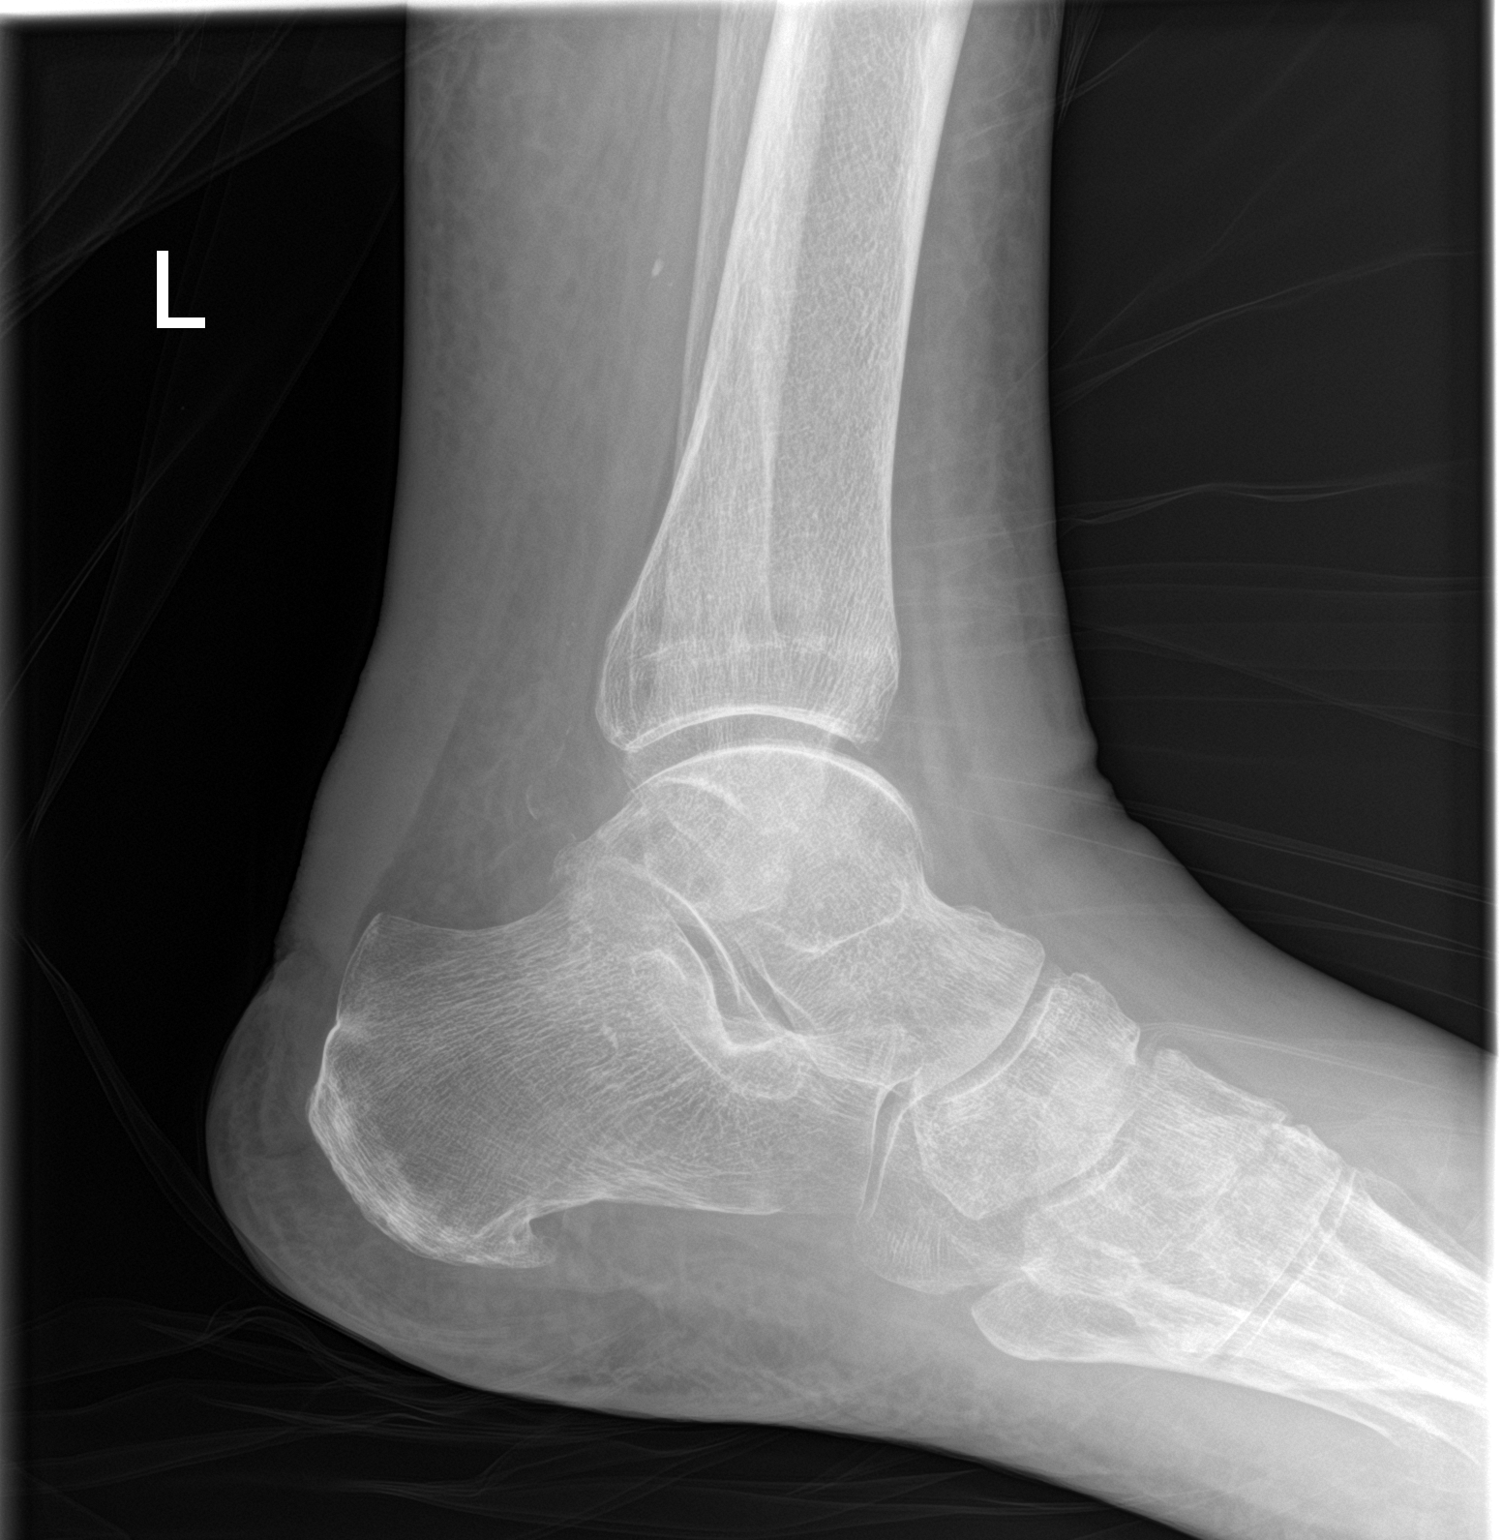

[3 of 3 positions shown; findings below may reference images not displayed]

FINDINGS: Mild soft tissue swelling is present bilaterally. Ankle joint is
located. No underlying fracture is present. No significant effusion
is present.
IMPRESSION: Mild soft tissue swelling without underlying fracture.
